# Patient Record
Sex: Female | Born: 1995 | Race: Black or African American | Hispanic: No | Marital: Single | Smoking: Never smoker
Health system: Southern US, Community
[De-identification: ages and names within clinical notes are randomized; demographics above are authoritative.]

## PROBLEM LIST (undated history)

## (undated) DIAGNOSIS — R519 Headache, unspecified: Secondary | ICD-10-CM

## (undated) DIAGNOSIS — J45909 Unspecified asthma, uncomplicated: Secondary | ICD-10-CM

## (undated) HISTORY — PX: NO PAST SURGERIES: SHX2092

## (undated) HISTORY — DX: Headache, unspecified: R51.9

---
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
## 2018-01-01 ENCOUNTER — Ambulatory Visit (INDEPENDENT_AMBULATORY_CARE_PROVIDER_SITE_OTHER): Payer: BLUE CROSS/BLUE SHIELD

## 2018-01-01 ENCOUNTER — Ambulatory Visit
Admission: EM | Admit: 2018-01-01 | Discharge: 2018-01-01 | Disposition: A | Discharge status: Home / Self Care | Payer: BLUE CROSS/BLUE SHIELD | Attending: Family Medicine | Admitting: Family Medicine

## 2018-01-01 ENCOUNTER — Other Ambulatory Visit: Payer: Self-pay

## 2018-01-01 ENCOUNTER — Encounter: Payer: Self-pay | Admitting: Emergency Medicine

## 2018-01-01 DIAGNOSIS — M25532 Pain in left wrist: Secondary | ICD-10-CM | POA: Diagnosis not present

## 2018-01-01 DIAGNOSIS — M659 Synovitis and tenosynovitis, unspecified: Secondary | ICD-10-CM

## 2018-01-01 NOTE — ED Triage Notes (Signed)
Patient in today c/o several month history of left wrist pain. No injury noted. Patient has tried OTC Aleve.

## 2018-01-01 NOTE — ED Triage Notes (Signed)
Patient has tried Mobic 15 mg without relief.

## 2018-01-01 NOTE — ED Provider Notes (Signed)
[facility] ____________________________________________  Time seen: Approximately 4:27 PM  I have reviewed the triage vital signs and the nursing notes.   HISTORY  Chief Complaint Wrist Pain  HPI Brooke Dominguez is a 22 y.o. female presenting for reevaluation of left medial wrist pain that is been present for approximately 6 months.  Patient reports that she has been seen by her primary care for the same and diagnosed with tendinitis.  Reports has also seen orthopedic and had an injection that only lasted for denies any recent trauma, direct injury, paresthesias, decreased range of motion.  States pain is mostly present with wrist rotation or resisted movement of the thumb.  States right-hand dominant, but does do a lot of repetitive motions with both hands.  States had been taking some  prescription anti-inflammatories intermittently without a lot of change.  States does still have Mobic at home.  States had a thumb restrictive wrist brace but lost, and use over-the-counter wrist brace without a lot of change.  Denies other aggravating or alleviating factors, pain radiation or other complaints.  No associated fevers.  Reports otherwise feels well.  Denies pregnancy.  PCP: [facility]  History reviewed. No pertinent past medical history.  There are no active problems to display for this patient.   History reviewed. No pertinent surgical history.   No current facility-administered medications for this encounter.   Current Outpatient Medications:  .  norethindrone-ethinyl estradiol (MICROGESTIN,JUNEL,LOESTRIN) 1-20 MG-MCG tablet, Take 1 tablet by mouth daily., Disp: , Rfl:   Allergies Patient has no known allergies.  Family History  Problem Relation Age of Onset  . Healthy Mother   . Healthy Father     Social History Social History   Tobacco Use  . Smoking status: Never Smoker  . Smokeless tobacco: Never Used  Substance Use Topics  . Alcohol use: Yes    Comment:  socially  . Drug use: No    Review of Systems Constitutional: No fever/chills Cardiovascular: Denies chest pain. Respiratory: Denies shortness of breath. Gastrointestinal: No abdominal pain.   Musculoskeletal: Negative for back pain. AS above.  Skin: Negative for rash.   ____________________________________________   PHYSICAL EXAM:  VITAL SIGNS: ED Triage Vitals [01/01/18 1540]  Enc Vitals Group     BP 137/83     Pulse Rate 94     Resp 16     Temp 98.8 F (37.1 C)     Temp Source Oral     SpO2 100 %     Weight 258 lb (117 kg)     Height 5\' 6"  (1.676 m)     Head Circumference      Peak Flow      Pain Score 8     Pain Loc      Pain Edu?      Excl. in GC?     Constitutional: Alert and oriented. Well appearing and in no acute distress. Cardiovascular: Normal rate, regular rhythm. Grossly normal heart sounds.  Good peripheral circulation. Respiratory: Normal respiratory effort without tachypnea nor retractions. Breath sounds are clear and equal bilaterally. No wheezes, rales, rhonchi. Musculoskeletal:   Bilateral distal radial pulses equal and easily palpated.  Bilateral hand grip strong and equal.  Left distal radial aspect of left wrist mild diffuse tenderness to palpation with increased pain on resisted thumb extension, no pain with thumb flexion, no snuffbox tenderness and no point bony tenderness.  Left wrist with full range of motion present.  Left hand no paresthesias, normal distal  sensation and capillary refill, no motor or tendon deficit noted. Neurologic:  Normal speech and language. Speech is normal. No gait instability.  Skin:  Skin is warm, dry and intact. No rash noted. Psychiatric: Mood and affect are normal. Speech and behavior are normal. Patient exhibits appropriate insight and judgment   ___________________________________________   LABS (all labs ordered are listed, but only abnormal results are displayed)  Labs Reviewed - No data to  display  RADIOLOGY  Dg Wrist Complete Left  Result Date: 01/01/2018 CLINICAL DATA:  Chronic pain.  Trauma 1 day prior EXAM: LEFT WRIST - COMPLETE 3+ VIEW COMPARISON:  Left hand November 15, 2010 FINDINGS: Frontal, oblique, lateral, and ulnar deviation scaphoid images were obtained. There is no evident fracture or dislocation. Joint spaces appear normal. No erosive change or intra-articular calcification. IMPRESSION: No fracture or dislocation.  No apparent arthropathy. Electronically Signed   By: Bretta BangWilliam  Woodruff III M.D.   On: 01/01/2018 16:58   ____________________________________________   PROCEDURES Procedures   Left wrist Velcro thumb spica splint applied.  INITIAL IMPRESSION / ASSESSMENT AND PLAN / ED COURSE  Pertinent labs & imaging results that were available during my care of the patient were reviewed by me and considered in my medical decision making (see chart for details).  Well-appearing patient.  No acute distress.  Suspected left wrist tenosynovitis that has continued.  Patient states that she has not previously had x-ray and request x-ray.  Left wrist x-ray no fracture dislocation no apparent arthropathy per radiologist.  Thumb spica splint applied.  Continue home Mobic.  Encourage reevaluation and follow-up with orthopedic.  Patient agrees this plan.  Discussed follow up with Primary care physician this week. Discussed follow up and return parameters including no resolution or any worsening concerns. Patient verbalized understanding and agreed to plan.   ____________________________________________   FINAL CLINICAL IMPRESSION(S) / ED DIAGNOSES  Final diagnoses:  Tenosynovitis of left wrist     ED Discharge Orders    None       Note: This dictation was prepared with Dragon dictation along with smaller phrase technology. Any transcriptional errors that result from this process are unintentional.         Renford DillsMiller, Camiyah Friberg, NP 01/01/18 1820

## 2018-01-01 NOTE — Discharge Instructions (Signed)
Take medication as prescribed. Rest. Drink plenty of fluids.  Wear wrist splint as discussed.  Follow-up with orthopedic.  Follow up with your primary care physician this week as needed. Return to Urgent care for new or worsening concerns.

## 2018-10-26 ENCOUNTER — Ambulatory Visit
Admission: EM | Admit: 2018-10-26 | Discharge: 2018-10-26 | Disposition: A | Discharge status: Home / Self Care | Payer: BLUE CROSS/BLUE SHIELD | Attending: Family Medicine | Admitting: Family Medicine

## 2018-10-26 ENCOUNTER — Encounter: Payer: Self-pay | Admitting: Emergency Medicine

## 2018-10-26 ENCOUNTER — Other Ambulatory Visit: Payer: Self-pay

## 2018-10-26 DIAGNOSIS — J4521 Mild intermittent asthma with (acute) exacerbation: Secondary | ICD-10-CM | POA: Diagnosis not present

## 2018-10-26 MED ORDER — PREDNISONE 50 MG PO TABS: ORAL_TABLET | ORAL | 0 refills | Status: DC

## 2018-10-26 MED ORDER — BENZONATATE 100 MG PO CAPS: 100.0000 mg | ORAL_CAPSULE | Freq: Three times a day (TID) | ORAL | 0 refills | Status: DC | PRN

## 2018-10-26 NOTE — Discharge Instructions (Signed)
This is viral. It has flared your asthma.  Medications as prescribed.  Albuterol as needed.  Take care  Dr. Adriana Simasook

## 2018-10-26 NOTE — ED Triage Notes (Signed)
Patient c/o cough and chest congestion for 2 weeks.  Patient denies fevers.  

## 2018-10-26 NOTE — ED Provider Notes (Signed)
[facility]    CSN: [id number] Arrival date & time: 10/26/18  1039  History   Chief Complaint Chief Complaint  Patient presents with  . Cough   HPI   22 year old female presents with cough.  2-week history of cough.  Reports congestion.  Reports productive cough.  Associated shortness of breath.  Patient states that she has a history of asthma.  She is been using albuterol with temporary relief.  No other medications or interventions tried.  Cough seems to be worse when she talks a lot.  No fever.  No other associated symptoms. No other complaints.  History reviewed as below.   PMH: Asthma, Obesity  OB History   None    Home Medications    Prior to Admission medications   Medication Sig Start Date End Date Taking? Authorizing Provider  norethindrone-ethinyl estradiol (MICROGESTIN,JUNEL,LOESTRIN) 1-20 MG-MCG tablet Take 1 tablet by mouth daily.   Yes [provider]  benzonatate (TESSALON) 100 MG capsule Take 1 capsule (100 mg total) by mouth 3 (three) times daily as needed. 10/26/18   Tommie Sams, DO  predniSONE (DELTASONE) 50 MG tablet 1 tablet daily x 5 days 10/26/18   Tommie Sams, DO   Family History Family History  Problem Relation Age of Onset  . Healthy Mother   . Healthy Father    Social History Social History   Tobacco Use  . Smoking status: Never Smoker  . Smokeless tobacco: Never Used  Substance Use Topics  . Alcohol use: Yes    Comment: socially  . Drug use: No   Allergies   Patient has no known allergies.   Review of Systems Review of Systems  Constitutional: Negative for fever.  Respiratory: Positive for cough and shortness of breath.    Physical Exam Triage Vital Signs ED Triage Vitals  Enc Vitals Group     BP 10/26/18 1056 136/80     Pulse Rate 10/26/18 1056 88     Resp 10/26/18 1056 16     Temp 10/26/18 1056 98.4 F (36.9 C)     Temp Source 10/26/18 1056 Oral     SpO2 10/26/18 1056 100 %     Weight  10/26/18 1054 257 lb (116.6 kg)     Height 10/26/18 1054 5\' 6"  (1.676 m)     Head Circumference --      Peak Flow --      Pain Score 10/26/18 1053 6     Pain Loc --      Pain Edu? --    Updated Vital Signs BP 136/80 (BP Location: Left Arm)   Pulse 88   Temp 98.4 F (36.9 C) (Oral)   Resp 16   Ht 5\' 6"  (1.676 m)   Wt 116.6 kg   LMP 10/05/2018   SpO2 100%   BMI 41.48 kg/m   Visual Acuity Right Eye Distance:   Left Eye Distance:   Bilateral Distance:    Right Eye Near:   Left Eye Near:    Bilateral Near:     Physical Exam  Constitutional: She is oriented to person, place, and time. She appears well-developed. No distress.  HENT:  Head: Normocephalic and atraumatic.  Mouth/Throat: Oropharynx is clear and moist.  Cardiovascular: Normal rate and regular rhythm.  Pulmonary/Chest: Effort normal and breath sounds normal. She has no wheezes. She has no rales.  Neurological: She is alert and oriented to person, place, and time.  Psychiatric: She has a normal mood and affect.  Her behavior is normal.  Nursing note and vitals reviewed.  UC Treatments / Results  Labs (all labs ordered are listed, but only abnormal results are displayed) Labs Reviewed - No data to display  EKG None  Radiology No results found.  Procedures Procedures (including critical care time)  Medications Ordered in UC Medications - No data to display  Initial Impression / Assessment and Plan / UC Course  I have reviewed the triage vital signs and the nursing notes.  Pertinent labs & imaging results that were available during my care of the patient were reviewed by me and considered in my medical decision making (see chart for details).    22 year old female presents with viral illness which is likely related to asthma exacerbation.  Treating with prednisone and Tessalon Perles.  Final Clinical Impressions(s) / UC Diagnoses   Final diagnoses:  Mild intermittent asthma with exacerbation      Discharge Instructions     This is viral. It has flared your asthma.  Medications as prescribed.  Albuterol as needed.  Take care  Dr. Adriana Simasook    ED Prescriptions    Medication Sig Dispense Auth. Provider   predniSONE (DELTASONE) 50 MG tablet 1 tablet daily x 5 days 5 tablet Pascual Mantel G, DO   benzonatate (TESSALON) 100 MG capsule Take 1 capsule (100 mg total) by mouth 3 (three) times daily as needed. 30 capsule Tommie Samsook, Tiyah Zelenak G, DO     Controlled Substance Prescriptions [place] Controlled Substance Registry consulted? Not Applicable   Tommie SamsCook, Voshon Petro G, DO 10/26/18 1205

## 2018-11-02 ENCOUNTER — Other Ambulatory Visit: Payer: Self-pay

## 2018-11-02 ENCOUNTER — Ambulatory Visit
Admission: EM | Admit: 2018-11-02 | Discharge: 2018-11-02 | Disposition: A | Discharge status: Home / Self Care | Payer: Self-pay | Attending: Family Medicine | Admitting: Family Medicine

## 2018-11-02 ENCOUNTER — Encounter: Payer: Self-pay | Admitting: Emergency Medicine

## 2018-11-02 ENCOUNTER — Ambulatory Visit (INDEPENDENT_AMBULATORY_CARE_PROVIDER_SITE_OTHER): Payer: BLUE CROSS/BLUE SHIELD

## 2018-11-02 DIAGNOSIS — B9789 Other viral agents as the cause of diseases classified elsewhere: Secondary | ICD-10-CM

## 2018-11-02 DIAGNOSIS — J4521 Mild intermittent asthma with (acute) exacerbation: Secondary | ICD-10-CM

## 2018-11-02 DIAGNOSIS — J029 Acute pharyngitis, unspecified: Secondary | ICD-10-CM

## 2018-11-02 DIAGNOSIS — J069 Acute upper respiratory infection, unspecified: Secondary | ICD-10-CM

## 2018-11-02 DIAGNOSIS — R05 Cough: Secondary | ICD-10-CM

## 2018-11-02 HISTORY — DX: Unspecified asthma, uncomplicated: J45.909

## 2018-11-02 LAB — RAPID STREP SCREEN (MED CTR MEBANE ONLY): Streptococcus, Group A Screen (Direct): NEGATIVE

## 2018-11-02 MED ORDER — PREDNISONE 20 MG PO TABS: ORAL_TABLET | ORAL | 0 refills | Status: DC

## 2018-11-02 NOTE — ED Triage Notes (Signed)
Patient in today with continued cough and chest congestion. Patient was seen on 11/25/18 and prescribed Tessalon Perles and Prednisone. Patient hasn't finish Prednisone, but feels like she is getting worse. Cough is now productive and patient feels sob.

## 2018-11-02 NOTE — ED Triage Notes (Signed)
Patient now states that she finished Prednisone, but still has LawyerTessalon Perles.

## 2018-11-02 NOTE — ED Provider Notes (Signed)
[facility]    CSN: [id number] Arrival date & time: 11/02/18  1203     History   Chief Complaint Chief Complaint  Patient presents with  . Cough    APPT    HPI Brooke Dominguez is a 22 y.o. female.   The history is provided by the patient.  Cough  Associated symptoms: sore throat and wheezing   URI  Presenting symptoms: congestion, cough and sore throat   Severity:  Moderate Onset quality:  Sudden Duration:  2 weeks Timing:  Constant Progression:  Partially resolved Chronicity:  New Relieved by:  Prescription medications (seen here about 1 week ago and diagnosed with asthma exacerbation) Associated symptoms: wheezing   Risk factors: chronic respiratory disease (asthma) and sick contacts     Past Medical History:  Diagnosis Date  . Asthma     There are no active problems to display for this patient.   Past Surgical History:  Procedure Laterality Date  . NO PAST SURGERIES      OB History   None      Home Medications    Prior to Admission medications   Medication Sig Start Date End Date Taking? Authorizing Provider  benzonatate (TESSALON) 100 MG capsule Take 1 capsule (100 mg total) by mouth 3 (three) times daily as needed. 10/26/18  Yes Cook, Jayce G, DO  norethindrone-ethinyl estradiol (MICROGESTIN,JUNEL,LOESTRIN) 1-20 MG-MCG tablet Take 1 tablet by mouth daily.    [provider]  predniSONE (DELTASONE) 20 MG tablet 3 tabs po qd x 2 days, then 2 tabs po qd x 2 days, then 1 tab po qd x 2 days, then half a tab po qd x 2 days 11/02/18   Payton Mccallumonty, Joandry Slagter, MD    Family History Family History  Problem Relation Age of Onset  . Healthy Mother   . Healthy Father     Social History Social History   Tobacco Use  . Smoking status: Never Smoker  . Smokeless tobacco: Never Used  Substance Use Topics  . Alcohol use: Yes    Comment: socially  . Drug use: No     Allergies   Pineapple; Nickel; and Other   Review of Systems Review  of Systems  HENT: Positive for congestion and sore throat.   Respiratory: Positive for cough and wheezing.      Physical Exam Triage Vital Signs ED Triage Vitals  Enc Vitals Group     BP 11/02/18 1218 (!) 142/93     Pulse Rate 11/02/18 1218 (!) 104     Resp 11/02/18 1218 20     Temp 11/02/18 1218 98.4 F (36.9 C)     Temp Source 11/02/18 1218 Oral     SpO2 11/02/18 1218 100 %     Weight 11/02/18 1219 257 lb (116.6 kg)     Height 11/02/18 1219 5\' 5"  (1.651 m)     Head Circumference --      Peak Flow --      Pain Score 11/02/18 1218 5     Pain Loc --      Pain Edu? --      Excl. in GC? --    No data found.  Updated Vital Signs BP (!) 142/93 (BP Location: Left Arm)   Pulse (!) 104   Temp 98.4 F (36.9 C) (Oral)   Resp 20   Ht 5\' 5"  (1.651 m)   Wt 116.6 kg   LMP 11/01/2018 (Exact Date)   SpO2 100%  BMI 42.77 kg/m   Visual Acuity Right Eye Distance:   Left Eye Distance:   Bilateral Distance:    Right Eye Near:   Left Eye Near:    Bilateral Near:     Physical Exam  Constitutional: She appears well-developed and well-nourished. No distress.  HENT:  Nose: Mucosal edema and rhinorrhea present. No nose lacerations, sinus tenderness, nasal deformity, septal deviation or nasal septal hematoma. No epistaxis.  No foreign bodies.  Mouth/Throat: Uvula is midline and mucous membranes are normal. Posterior oropharyngeal erythema present. No posterior oropharyngeal edema or tonsillar abscesses. No tonsillar exudate.  Neck: Normal range of motion. Neck supple. No thyromegaly present.  Cardiovascular: Normal rate, regular rhythm and normal heart sounds.  Pulmonary/Chest: Effort normal and breath sounds normal. No stridor. No respiratory distress. She has no wheezes. She has no rales.  Lymphadenopathy:    She has no cervical adenopathy.  Skin: She is not diaphoretic.  Nursing note and vitals reviewed.    UC Treatments / Results  Labs (all labs ordered are listed, but  only abnormal results are displayed) Labs Reviewed  RAPID STREP SCREEN ([facility] ONLY)  CULTURE, GROUP A STREP [facility])    EKG None  Radiology Dg Chest 2 View  Result Date: 11/02/2018 CLINICAL DATA:  Productive cough with shortness of breath for 3 weeks. Persistent cough and upper chest soreness. EXAM: CHEST - 2 VIEW COMPARISON:  None. FINDINGS: Heart size and mediastinal contours are normal. Lungs appear clear. No pleural effusion seen. Osseous structures about the chest are unremarkable. IMPRESSION: No active cardiopulmonary disease. No evidence of pneumonia or pulmonary edema. Electronically Signed   By: Bary Richard M.D.   On: 11/02/2018 12:55    Procedures Procedures (including critical care time)  Medications Ordered in UC Medications - No data to display  Initial Impression / Assessment and Plan / UC Course  I have reviewed the triage vital signs and the nursing notes.  Pertinent labs & imaging results that were available during my care of the patient were reviewed by me and considered in my medical decision making (see chart for details).      Final Clinical Impressions(s) / UC Diagnoses   Final diagnoses:  Viral URI with cough  Mild intermittent asthma with exacerbation    ED Prescriptions    Medication Sig Dispense Auth. Provider   predniSONE (DELTASONE) 20 MG tablet 3 tabs po qd x 2 days, then 2 tabs po qd x 2 days, then 1 tab po qd x 2 days, then half a tab po qd x 2 days 13 tablet Payton Mccallum, MD     1. Lab/chest x-ray results and diagnosis reviewed with patient 2. rx as per orders above; reviewed possible side effects, interactions, risks and benefits  3. Recommend supportive treatment with continue current asthma inhalers 4. Follow-up prn if symptoms worsen or don't improve    Controlled Substance Prescriptions [place] Controlled Substance Registry consulted? Not Applicable   Payton Mccallum, MD 11/02/18 1406

## 2018-11-05 LAB — CULTURE, GROUP A STREP (THRC)

## 2019-09-05 ENCOUNTER — Other Ambulatory Visit: Payer: Self-pay | Admitting: *Deleted

## 2019-09-05 DIAGNOSIS — Z20822 Contact with and (suspected) exposure to covid-19: Secondary | ICD-10-CM

## 2019-09-06 LAB — NOVEL CORONAVIRUS, NAA: SARS-CoV-2, NAA: NOT DETECTED

## 2019-09-18 ENCOUNTER — Other Ambulatory Visit: Payer: Self-pay | Admitting: *Deleted

## 2019-09-18 DIAGNOSIS — Z20822 Contact with and (suspected) exposure to covid-19: Secondary | ICD-10-CM

## 2019-09-19 LAB — NOVEL CORONAVIRUS, NAA: SARS-CoV-2, NAA: NOT DETECTED

## 2019-10-08 IMAGING — CR DG CHEST 2V
2 series · 2 of 2 positions shown · non-contrast
Comparison: None.

CLINICAL DATA: Productive cough with shortness of breath for 3
weeks. Persistent cough and upper chest soreness.

EXAM:
CHEST - 2 VIEW

[chest pa]
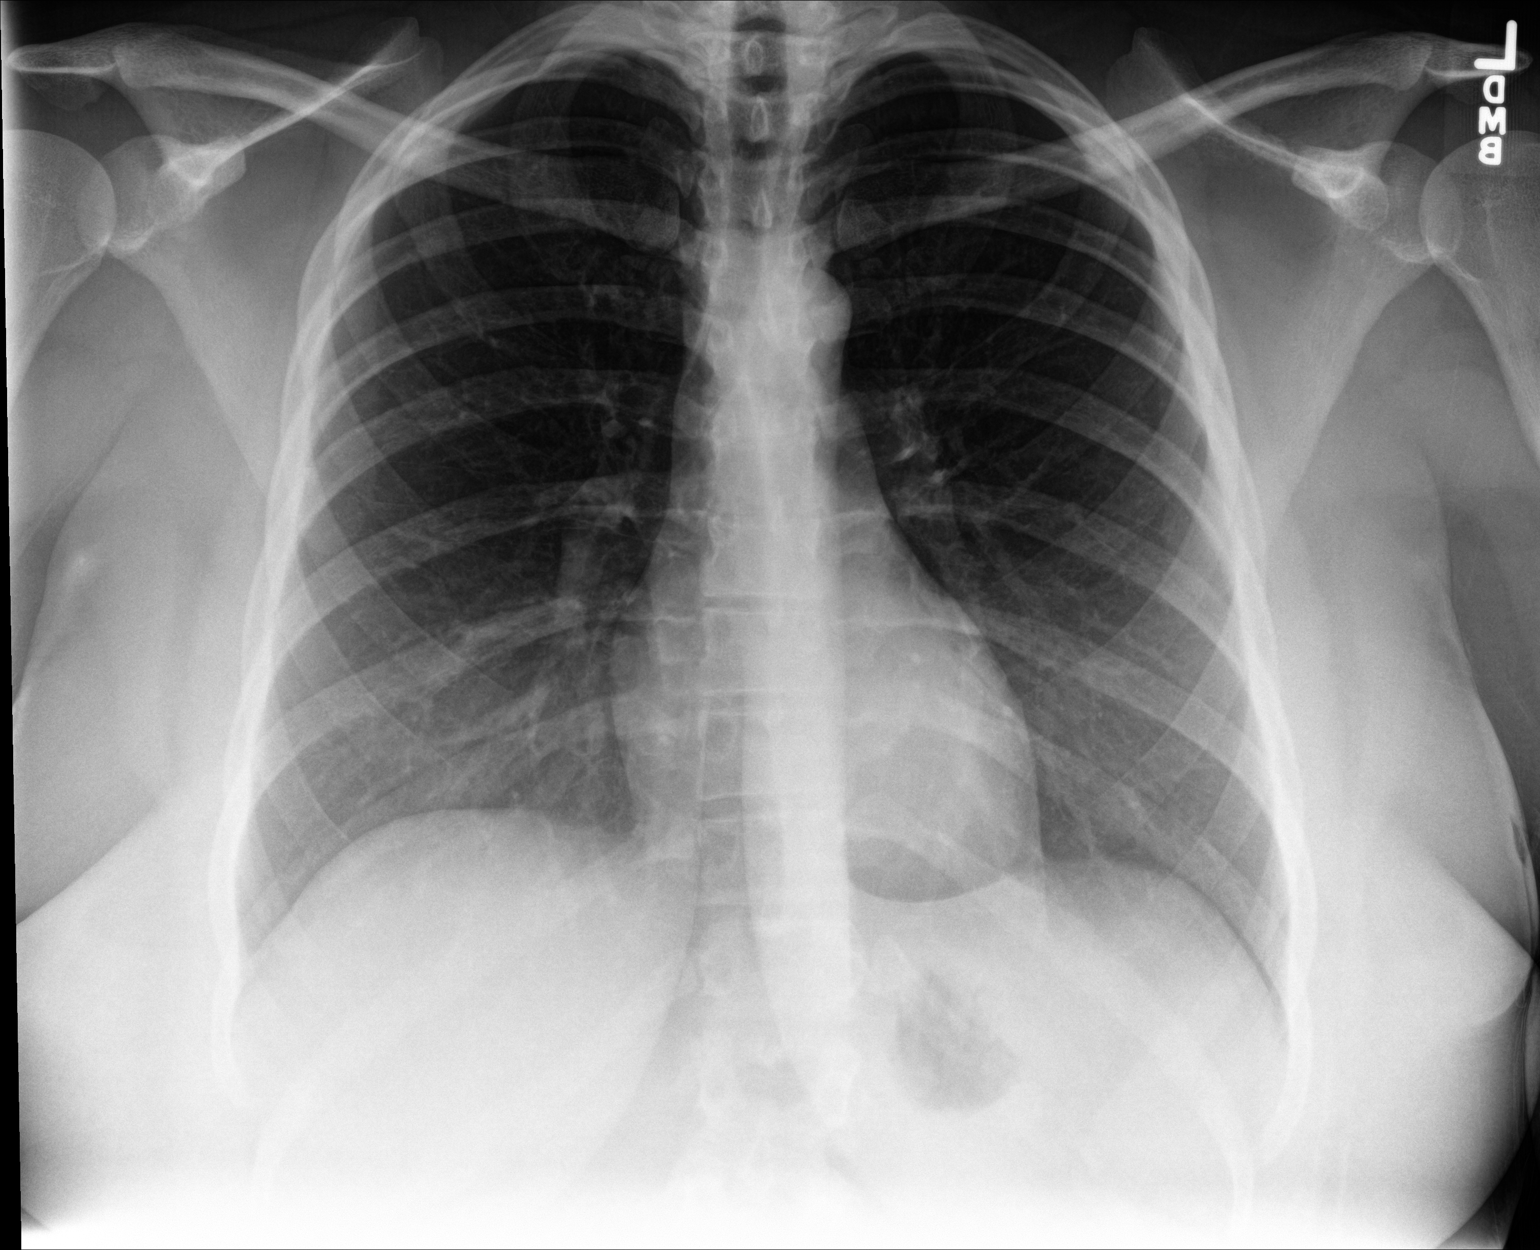

[chest lat]
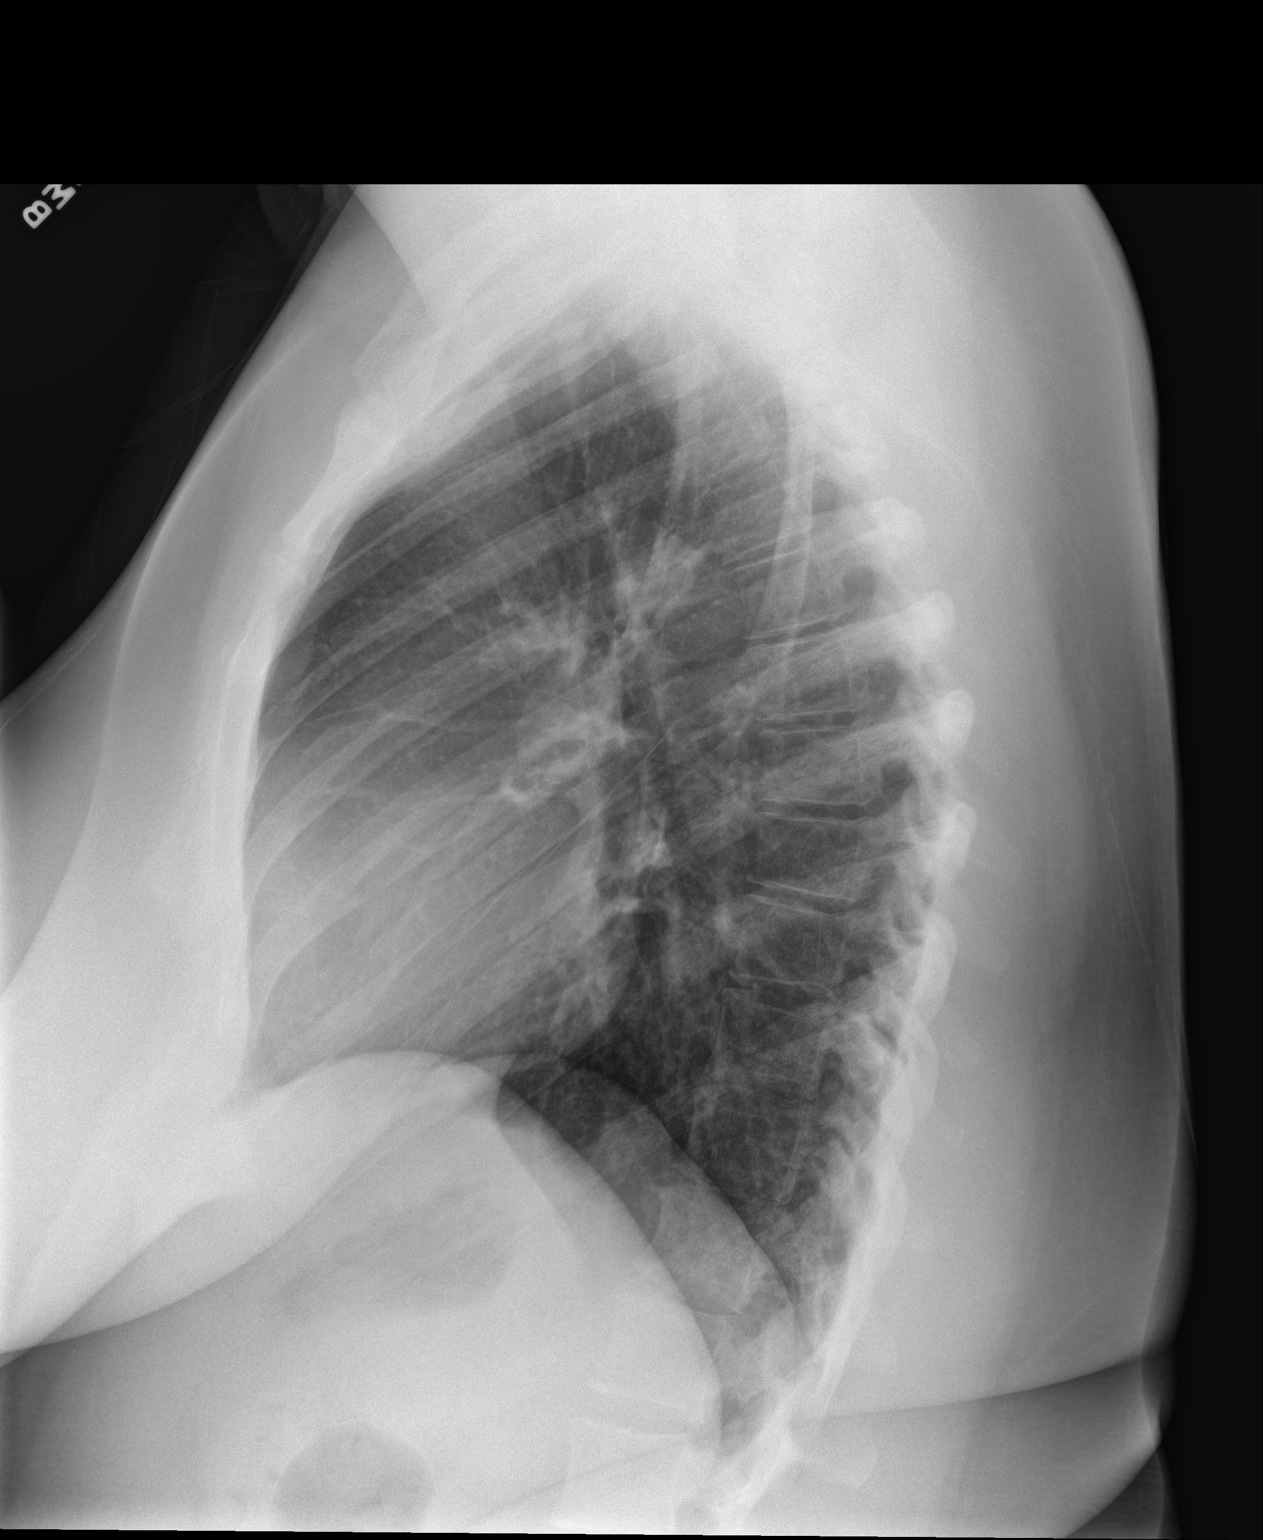

[2 of 2 positions shown; findings below may reference images not displayed]

FINDINGS: Heart size and mediastinal contours are normal. Lungs appear clear.
No pleural effusion seen. Osseous structures about the chest are
unremarkable.
IMPRESSION: No active cardiopulmonary disease. No evidence of pneumonia or
pulmonary edema.

## 2019-11-06 ENCOUNTER — Other Ambulatory Visit: Payer: Self-pay

## 2019-11-06 DIAGNOSIS — Z20822 Contact with and (suspected) exposure to covid-19: Secondary | ICD-10-CM

## 2019-11-07 LAB — NOVEL CORONAVIRUS, NAA: SARS-CoV-2, NAA: NOT DETECTED

## 2020-11-28 HISTORY — PX: DILATION AND CURETTAGE OF UTERUS: SHX78

## 2021-03-17 ENCOUNTER — Emergency Department (HOSPITAL_COMMUNITY)
Admission: EM | Admit: 2021-03-17 | Discharge: 2021-03-17 | Disposition: A | Discharge status: Home / Self Care | Payer: Self-pay | Attending: Emergency Medicine | Admitting: Emergency Medicine

## 2021-03-17 ENCOUNTER — Encounter (HOSPITAL_COMMUNITY): Payer: Self-pay | Admitting: Emergency Medicine

## 2021-03-17 ENCOUNTER — Other Ambulatory Visit: Payer: Self-pay

## 2021-03-17 DIAGNOSIS — H66011 Acute suppurative otitis media with spontaneous rupture of ear drum, right ear: Secondary | ICD-10-CM | POA: Insufficient documentation

## 2021-03-17 DIAGNOSIS — J45909 Unspecified asthma, uncomplicated: Secondary | ICD-10-CM | POA: Insufficient documentation

## 2021-03-17 DIAGNOSIS — H60503 Unspecified acute noninfective otitis externa, bilateral: Secondary | ICD-10-CM | POA: Insufficient documentation

## 2021-03-17 MED ORDER — CIPRO HC 0.2-1 % OT SUSP: 3.0000 [drp] | Freq: Two times a day (BID) | OTIC | 0 refills | Status: DC

## 2021-03-17 MED ORDER — AMOXICILLIN 500 MG PO CAPS: 500.0000 mg | ORAL_CAPSULE | Freq: Three times a day (TID) | ORAL | 0 refills | Status: DC

## 2021-03-17 MED ORDER — BACITRACIN ZINC 500 UNIT/GM EX OINT: 1.0000 "application " | TOPICAL_OINTMENT | Freq: Two times a day (BID) | CUTANEOUS | 0 refills | Status: DC

## 2021-03-17 MED ORDER — AMOXICILLIN 500 MG PO CAPS
500.0000 mg | ORAL_CAPSULE | Freq: Once | ORAL | Status: AC
  Administered 2021-03-17: 500 mg via ORAL
  Filled 2021-03-17: qty 1

## 2021-03-17 MED ORDER — CIPROFLOXACIN-DEXAMETHASONE 0.3-0.1 % OT SUSP
4.0000 [drp] | Freq: Once | OTIC | Status: AC
  Administered 2021-03-17: 4 [drp] via OTIC
  Filled 2021-03-17: qty 7.5

## 2021-03-17 NOTE — ED Provider Notes (Signed)
[facility]-EMERGENCY DEPT Provider Note   CSN: [id number] Arrival date & time: 03/17/21  2103     History Chief Complaint  Patient presents with  . Otalgia    Brooke Dominguez is a 25 y.o. female with history significant for asthma who presents for evaluation of bilateral ear pain.  Patient states she has had intermittent pain since February.  Worsening over the last week.  She has noted discharge to her bilateral ears are worse usually clear.  She feels like she has a muffled hearing to her right ear.  She does use Q-tips daily.  No fever, chills, nausea, vomiting, neck pain, neck stiffness, facial swelling, tinnitus.  Denies additional aggravating or alleviating factors.  She has not followed with anyone for this previously.  Pain in 8/10.    No hx of DM.  Denies chance of pregnancy.  History obtained from patient and past medical records.  No interpreter used.  HPI     Past Medical History:  Diagnosis Date  . Asthma     There are no problems to display for this patient.   Past Surgical History:  Procedure Laterality Date  . NO PAST SURGERIES       OB History   No obstetric history on file.     Family History  Problem Relation Age of Onset  . Healthy Mother   . Healthy Father     Social History   Tobacco Use  . Smoking status: Never Smoker  . Smokeless tobacco: Never Used  Vaping Use  . Vaping Use: Never used  Substance Use Topics  . Alcohol use: Yes    Comment: socially  . Drug use: No    Home Medications Prior to Admission medications   Medication Sig Start Date End Date Taking? Authorizing Provider  albuterol (VENTOLIN HFA) 108 (90 Base) MCG/ACT inhaler Inhale 2 puffs into the lungs every 6 (six) hours as needed for wheezing. 03/06/20  Yes [provider]  amoxicillin (AMOXIL) 500 MG capsule Take 1 capsule (500 mg total) by mouth 3 (three) times daily. 03/17/21  Yes Kaziah Krizek A, PA-C  bacitracin ointment Apply 1  application topically 2 (two) times daily. 03/17/21  Yes Max Nuno A, PA-C  ciprofloxacin-hydrocortisone (CIPRO HC) OTIC suspension Place 3 drops into both ears 2 (two) times daily. 03/17/21  Yes Loralyn Rachel A, PA-C  OVER THE COUNTER MEDICATION Place 3 drops into both ears daily as needed (for itching in the ears).   Yes [provider]  norethindrone-ethinyl estradiol (MICROGESTIN,JUNEL,LOESTRIN) 1-20 MG-MCG tablet Take 1 tablet by mouth daily. Patient not taking: No sig reported  03/17/21  [provider]    Allergies    Pineapple, Nickel, and Other  Review of Systems   Review of Systems  Constitutional: Negative.   HENT: Positive for ear discharge and ear pain.   Respiratory: Negative.   Cardiovascular: Negative.   Gastrointestinal: Negative.   Genitourinary: Negative.   Musculoskeletal: Negative.   Skin: Negative.   Neurological: Negative.   All other systems reviewed and are negative.   Physical Exam Updated Vital Signs BP (!) 142/95 (BP Location: Right Arm)   Pulse (!) 109   Temp 98.7 F (37.1 C) (Oral)   Resp 16   Ht 5\' 6"  (1.676 m)   Wt 124.3 kg   SpO2 100%   BMI 44.22 kg/m   Physical Exam Vitals and nursing note reviewed.  Constitutional:      General: She is not in  acute distress.    Appearance: She is well-developed. She is not ill-appearing, toxic-appearing or diaphoretic.  HENT:     Head: Normocephalic and atraumatic.     Jaw: There is normal jaw occlusion.     Right Ear: Tenderness present. There is no impacted cerumen. Tympanic membrane is erythematous and bulging.     Left Ear: Tenderness present. There is no impacted cerumen. Tympanic membrane is perforated.     Ears:     Comments: Right ear-perforated TM.  Yellow, thin discharge in middle ear canal.  Dry, flaky skin to external ear canal.  Tenderness over tragus  Left ear-bulging left TM.  She is thin, yellow discharge in her middle ear canal.  She is dry, flaky skin to  external ear canal.  Tenderness over tragus.    Nose: Nose normal.     Mouth/Throat:     Mouth: Mucous membranes are moist.  Eyes:     Pupils: Pupils are equal, round, and reactive to light.  Neck:     Vascular: No carotid bruit.     Comments: Full range of motion.  No meningismus. Cardiovascular:     Rate and Rhythm: Normal rate.  Pulmonary:     Effort: No respiratory distress.  Abdominal:     General: There is no distension.  Musculoskeletal:        General: Normal range of motion.     Cervical back: Normal range of motion and neck supple. No rigidity or tenderness.  Lymphadenopathy:     Cervical: No cervical adenopathy.  Skin:    General: Skin is warm and dry.  Neurological:     Mental Status: She is alert.     ED Results / Procedures / Treatments   Labs (all labs ordered are listed, but only abnormal results are displayed) Labs Reviewed - No data to display  EKG None  Radiology No results found.  Procedures Procedures   Medications Ordered in ED Medications  ciprofloxacin-dexamethasone (CIPRODEX) 0.3-0.1 % OTIC (EAR) suspension 4 drop (4 drops Both EARS Given 03/17/21 2204)  amoxicillin (AMOXIL) capsule 500 mg (500 mg Oral Given 03/17/21 2204)    ED Course  I have reviewed the triage vital signs and the nursing notes.  Pertinent labs & imaging results that were available during my care of the patient were reviewed by me and considered in my medical decision making (see chart for details).  Patient here for bilateral ear pain.  She is afebrile, nonseptic, not ill-appearing.  She has no neck stiffness or neck rigidity.  She has no pulsatile tenderness.  No facial swelling.  Right ear does have a perforated TM.  Left ear with bulging left TM with fluid level.  She has thin, yellow copious discharge to bilateral middle ear canals.  She has dry, flaking skin to external ear canals.  Patient does not appear septic.  No systemic symptoms.  We will treat for ruptured TM  on right, bilateral otitis externa.  Given medications here in ED.  She will need to follow-up with ENT.  No canal occlusion, Pt afebrile in NAD. Exam non concerning for mastoiditis, cellulitis or malignant OE.   The patient has been appropriately medically screened and/or stabilized in the ED. I have low suspicion for any other emergent medical condition which would require further screening, evaluation or treatment in the ED or require inpatient management.  Patient is hemodynamically stable and in no acute distress.  Patient able to ambulate in department prior to ED.  Evaluation  does not show acute pathology that would require ongoing or additional emergent interventions while in the emergency department or further inpatient treatment.  I have discussed the diagnosis with the patient and answered all questions.  Pain is been managed while in the emergency department and patient has no further complaints prior to discharge.  Patient is comfortable with plan discussed in room and is stable for discharge at this time.  I have discussed strict return precautions for returning to the emergency department.  Patient was encouraged to follow-up with PCP/specialist refer to at discharge.    MDM Rules/Calculators/A&P                          Final Clinical Impression(s) / ED Diagnoses Final diagnoses:  Acute otitis externa of both ears, unspecified type  Non-recurrent acute suppurative otitis media of right ear with spontaneous rupture of tympanic membrane    Rx / DC Orders ED Discharge Orders         Ordered    ciprofloxacin-hydrocortisone (CIPRO HC) OTIC suspension  2 times daily        03/17/21 2231    amoxicillin (AMOXIL) 500 MG capsule  3 times daily        03/17/21 2231    bacitracin ointment  2 times daily        03/17/21 2231           Calysta Craigo A, PA-C 03/17/21 2236    Arby Barrette, MD 03/30/21 1536

## 2021-03-17 NOTE — ED Triage Notes (Signed)
Patient presents with recurrent ear infections since mid February. While the symptoms are felt bilaterally, the right ear is worse. She woke up in moderate severe pain today. OTC drops are not helping.

## 2021-03-17 NOTE — Discharge Instructions (Addendum)
Place 4 drops in your bilateral ears daily.  You may use the drops provided to you here in the emergency department.  Take the oral antibiotics.  Take with food.  Use the antibiotic for the outside of your ear.  Follow up with ear nose and throat providers if your symptoms do not resolve.  Their contact information is listed on discharge paperwork.  Return for any worsening symptoms

## 2021-04-16 ENCOUNTER — Other Ambulatory Visit: Payer: Self-pay

## 2021-04-16 ENCOUNTER — Emergency Department (HOSPITAL_COMMUNITY)
Admission: EM | Admit: 2021-04-16 | Discharge: 2021-04-16 | Disposition: A | Discharge status: Home / Self Care | Payer: Self-pay | Attending: Emergency Medicine | Admitting: Emergency Medicine

## 2021-04-16 ENCOUNTER — Encounter (HOSPITAL_COMMUNITY): Payer: Self-pay | Admitting: Emergency Medicine

## 2021-04-16 ENCOUNTER — Emergency Department (HOSPITAL_COMMUNITY): Payer: Self-pay

## 2021-04-16 DIAGNOSIS — S93402A Sprain of unspecified ligament of left ankle, initial encounter: Secondary | ICD-10-CM | POA: Insufficient documentation

## 2021-04-16 DIAGNOSIS — J45909 Unspecified asthma, uncomplicated: Secondary | ICD-10-CM | POA: Insufficient documentation

## 2021-04-16 DIAGNOSIS — X58XXXA Exposure to other specified factors, initial encounter: Secondary | ICD-10-CM | POA: Insufficient documentation

## 2021-04-16 NOTE — Discharge Instructions (Addendum)
You were seen in the emergency department today for your ankle pain. Your x-ray was reassuring, there are no broken bones or dislocations.  You have sprained your ankle.  You have been placed in a lace up brace to wear for extra support, you may use the crutches as needed.  Please begin to bear weight on your ankle as tolerated over the next few days. You may take Tylenol and ibuprofen at home as needed for your pain. Return to ER if you develop any numbness, tingling, weakness in your foot, or any other new severe symptoms.

## 2021-04-16 NOTE — ED Triage Notes (Signed)
Patient here from home reporting left ankle pain after trip but no fall. Denies hitting on anything. Ambulatory.
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
## 2021-09-21 ENCOUNTER — Ambulatory Visit (INDEPENDENT_AMBULATORY_CARE_PROVIDER_SITE_OTHER): Payer: 59 | Admitting: Psychology

## 2021-09-21 DIAGNOSIS — F4321 Adjustment disorder with depressed mood: Secondary | ICD-10-CM | POA: Diagnosis not present

## 2021-10-19 ENCOUNTER — Ambulatory Visit: Payer: 59 | Admitting: Psychology

## 2021-11-04 ENCOUNTER — Ambulatory Visit (INDEPENDENT_AMBULATORY_CARE_PROVIDER_SITE_OTHER): Payer: 59 | Admitting: Psychology

## 2021-11-04 DIAGNOSIS — F4321 Adjustment disorder with depressed mood: Secondary | ICD-10-CM

## 2021-11-04 NOTE — Progress Notes (Signed)
 Behavioral Health Counselor/Therapist Progress Note  Patient ID: Brooke Dominguez, MRN: [id number],    Date: 11/04/2021  Time Spent: 60   Treatment Type: Individual Therapy  Reported Symptoms: sadness, grief  Mental Status Exam: Appearance:  Casual     Behavior: Appropriate  Motor: Normal  Speech/Language:  Normal Rate  Affect: Appropriate  Mood: normal  Thought process: normal  Thought content:   WNL  Sensory/Perceptual disturbances:   WNL  Orientation: oriented to person, place, and time/date  Attention: Good  Concentration: Good  Memory: WNL  Fund of knowledge:  Good  Insight:   Good  Judgment:  Good  Impulse Control: Good   Risk Assessment: Danger to Self:  No Self-injurious Behavior: No Danger to Others: No Duty to Warn:no Physical Aggression / Violence:No  Access to Firearms a concern: No  Gang Involvement:No   Subjective:  Pt present for face-to-face individual therapy via video Webex.  Pt consents to telehealth video session due to COVID 19 pandemic. Location of pt: home Location of therapist: home office.   Pt states she has had her ups and downs but overall she feels like she has been doing a little better.   Pt talked about talking to her boyfriend about asking for more support and he responded favorably and has increased his support.  Pt talked about feeling sad when she sees babies.  Addressed pt's grief and helped her process her feelings.  Worked with pt on her grief process.  Addressed ways she can memorialize the babies (she aborted twins).   Pt talked about the decision to have the abortion.  She feels like her boyfriend was 80% of the decision and she was only about 20%.    Pt was also worried about what her parents would think bc they are ministers.    Pt has also relied on friends more and shared her feelings.  Pt has started to journal her feelings.   Pt talked about work.  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    Worked on [employer], dispensing. Provided supportive therapy.     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 Plan to see pt in a month.   Kayliegh Boyers, LCSW

## 2021-12-20 ENCOUNTER — Ambulatory Visit (INDEPENDENT_AMBULATORY_CARE_PROVIDER_SITE_OTHER): Payer: 59 | Admitting: Psychology

## 2021-12-20 DIAGNOSIS — F4321 Adjustment disorder with depressed mood: Secondary | ICD-10-CM
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
## 2022-01-04 ENCOUNTER — Ambulatory Visit (INDEPENDENT_AMBULATORY_CARE_PROVIDER_SITE_OTHER): Payer: 59 | Admitting: Psychology

## 2022-01-04 DIAGNOSIS — F4321 Adjustment disorder with depressed mood: Secondary | ICD-10-CM

## 2022-01-04 NOTE — Progress Notes (Signed)
[facility] Counselor/Therapist Progress Note  Patient ID: Brooke Dominguez, MRN: [id number],    Date: 01/04/2022  Time Spent: 11:00am-11:45am    45 minutes  Treatment Type: Individual Therapy  Reported Symptoms: sadness, grief  Mental Status Exam: Appearance:  Casual     Behavior: Appropriate  Motor: Normal  Speech/Language:  Normal Rate  Affect: Appropriate  Mood: normal  Thought process: normal  Thought content:   WNL  Sensory/Perceptual disturbances:   WNL  Orientation: oriented to person, place, and time/date  Attention: Good  Concentration: Good  Memory: WNL  Fund of knowledge:  Good  Insight:   Good  Judgment:  Good  Impulse Control: Good   Risk Assessment: Danger to Self:  No Self-injurious Behavior: No Danger to Others: No Duty to Warn:no Physical Aggression / Violence:No  Access to Firearms a concern: No  Gang Involvement:No   Subjective:  Pt present for face-to-face individual therapy via video Webex.  Pt consents to telehealth video session due to COVID 19 pandemic. Location of pt: home Location of therapist: home office.   Pt talked about how she coped with February 4th being the due date of the babies she aborted.   Pt had a lot of thoughts about wondering what life would look like if she had kept the babies.   Pt's boyfriend Josph Macho was minimally supportive but her friends were supportive of her.   Helped pt process her feelings and grief.   She feels badly that pt's boyfriend still has not had pt meet his children yet.   Pt has had conversations with her boyfriend about how this upsets her.  Helped pt process her feelings and relationship dynamics.  Pt has been going to the gym for a month to help her increase self care.  Pt is trying to be busy so she does not focus on grief as much.    Pt talked about work.  Work is a good distraction for pt but it is stressful at times.  Worked on Child psychotherapist.    Provided supportive therapy.      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  (Treatment Plan Target Date: 09/21/2022) Pt is progressing toward treatment goals.   Plan to continue to see pt every two weeks.       Hertha Gergen, LCSW

## 2022-01-28 ENCOUNTER — Ambulatory Visit: Payer: 59 | Admitting: Psychology

## 2022-01-28 DIAGNOSIS — F4321 Adjustment disorder with depressed mood: Secondary | ICD-10-CM

## 2022-02-15 ENCOUNTER — Ambulatory Visit (INDEPENDENT_AMBULATORY_CARE_PROVIDER_SITE_OTHER): Payer: 59 | Admitting: Psychology

## 2022-02-15 DIAGNOSIS — F4321 Adjustment disorder with depressed mood: Secondary | ICD-10-CM | POA: Diagnosis not present

## 2022-02-15 NOTE — Progress Notes (Signed)
[facility] Counselor/Therapist Progress Note ? ?Patient ID: Brooke Dominguez, MRN: [id number],   ? ?Date: 02/15/2022 ? ?Time Spent: 10:00am-10:45am    45 minutes ? ?Treatment Type: Individual Therapy ? ?Reported Symptoms: sadness, stress ? ?Mental Status Exam: ?Appearance:  Casual     ?Behavior: Appropriate  ?Motor: Normal  ?Speech/Language:  Normal Rate  ?Affect: Appropriate  ?Mood: normal  ?Thought process: normal  ?Thought content:   WNL  ?Sensory/Perceptual disturbances:   WNL  ?Orientation: oriented to person, place, and time/date  ?Attention: Good  ?Concentration: Good  ?Memory: WNL  ?Fund of knowledge:  Good  ?Insight:   Good  ?Judgment:  Good  ?Impulse Control: Good  ? ?Risk Assessment: ?Danger to Self:  No ?Self-injurious Behavior: No ?Danger to Others: No ?Duty to Warn:no ?Physical Aggression / Violence:No  ?Access to Firearms a concern: No  ?Gang Involvement:No  ? ?Subjective:  ?Pt present for face-to-face individual therapy via video Webex.  Pt consents to telehealth video session due to COVID 19 pandemic. ?Location of pt: home ?Location of therapist: home office.   ?Pt talked about work.  It has been busy and stressful.  ?Worked on stress management.    ?Pt went to a baby shower which was difficult for her since she had her abortion.  Addressed pt's grief process about her abortion.  She is making progress and feeling better than she was.    ?Pt talked about her relationship with her boyfriend.  She still has not met his kids.  Pt's father talked to pt about concerns that pt is getting taken advantage of by her boyfriend.   He is concerned that pt and boyfriend have been together for 2 years and pt has not met his kids yet.   Pt told her boyfriend about the conversation and he got frustrated with her.  They had an argument.  Addressed the interaction.  Helped pt process her feelings and relationship dynamics.   ?Provided supportive therapy.    ? ?Interventions: Cognitive Behavioral Therapy  and Supportive Therapy ? 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 ?Plan:  See pt's Treatment Plan for depression in Therapy Charts.  (Treatment Plan Target Date: 09/21/2022) ?Pt is progressing toward treatment goals.   ?Plan to continue to see pt every two weeks.     ? ? ?Terri Bauert, LCSW ? ? ?

## 2022-03-29 ENCOUNTER — Encounter: Payer: Self-pay | Admitting: Obstetrics and Gynecology

## 2022-03-29 ENCOUNTER — Ambulatory Visit: Payer: 59 | Admitting: Obstetrics and Gynecology

## 2022-03-29 ENCOUNTER — Other Ambulatory Visit (HOSPITAL_COMMUNITY)
Admission: RE | Admit: 2022-03-29 | Discharge: 2022-03-29 | Disposition: A | Discharge status: Home / Self Care | Payer: 59 | Source: Ambulatory Visit | Attending: Obstetrics and Gynecology | Admitting: Obstetrics and Gynecology

## 2022-03-29 DIAGNOSIS — Z01419 Encounter for gynecological examination (general) (routine) without abnormal findings: Secondary | ICD-10-CM | POA: Insufficient documentation

## 2022-03-29 DIAGNOSIS — Z309 Encounter for contraceptive management, unspecified: Secondary | ICD-10-CM | POA: Insufficient documentation

## 2022-03-29 DIAGNOSIS — Z30011 Encounter for initial prescription of contraceptive pills: Secondary | ICD-10-CM | POA: Diagnosis not present

## 2022-03-29 DIAGNOSIS — Z202 Contact with and (suspected) exposure to infections with a predominantly sexual mode of transmission: Secondary | ICD-10-CM

## 2022-03-29 MED ORDER — NORETHIN ACE-ETH ESTRAD-FE 1-20 MG-MCG(24) PO TABS: 1.0000 | ORAL_TABLET | Freq: Every day | ORAL | 11 refills | Status: DC

## 2022-03-29 NOTE — Progress Notes (Signed)
Brooke Dominguez is a 26 y.o. G96P0010 female here for a routine annual gynecologic exam.  Current complaints: Desires STD testing and OCP's.   Denies abnormal vaginal bleeding, discharge, pelvic pain, problems with intercourse or other gynecologic concerns.  ?  ?Gynecologic History ?Patient's last menstrual period was 03/15/2022 (exact date). ?Contraception: condoms ?Last Pap: Uncertain. Results were: normal ?Last mammogram: NA.  ? ?Obstetric History ?OB History  ?Gravida Para Term Preterm AB Living  ?1       1    ?SAB IAB Ectopic Multiple Live Births  ?        0  ?  ?# Outcome Date GA Lbr Len/2nd Weight Sex Delivery Anes PTL Lv  ?1 AB 2022          ? ? ?Past Medical History:  ?Diagnosis Date  ? Asthma   ? Headache   ? ? ?Past Surgical History:  ?Procedure Laterality Date  ? NO PAST SURGERIES    ? ? ?Current Outpatient Medications on File Prior to Visit  ?Medication Sig Dispense Refill  ? albuterol (VENTOLIN HFA) 108 (90 Base) MCG/ACT inhaler Inhale 2 puffs into the lungs every 6 (six) hours as needed for wheezing.    ? bacitracin ointment Apply 1 application topically 2 (two) times daily. 120 g 0  ? OVER THE COUNTER MEDICATION Place 3 drops into both ears daily as needed (for itching in the ears).    ? [DISCONTINUED] norethindrone-ethinyl estradiol (MICROGESTIN,JUNEL,LOESTRIN) 1-20 MG-MCG tablet Take 1 tablet by mouth daily. (Patient not taking: No sig reported)    ? ?No current facility-administered medications on file prior to visit.  ? ? ?Allergies  ?Allergen Reactions  ? Pineapple Itching and Shortness Of Breath  ? Nickel Itching  ? Other Itching  ?  Cats/dogs and seasonal allergies causes itching.  ? ? ?Social History  ? ?Socioeconomic History  ? Marital status: Single  ?  Spouse name: Not on file  ? Number of children: Not on file  ? Years of education: Not on file  ? Highest education level: Not on file  ?Occupational History  ? Not on file  ?Tobacco Use  ? Smoking status: Never  ? Smokeless tobacco: Never   ?Vaping Use  ? Vaping Use: Never used  ?Substance and Sexual Activity  ? Alcohol use: Yes  ?  Comment: socially  ? Drug use: No  ? Sexual activity: Yes  ?  Birth control/protection: Pill  ?Other Topics Concern  ? Not on file  ?Social History Narrative  ? Not on file  ? ?Social Determinants of Health  ? ?Financial Resource Strain: Not on file  ?Food Insecurity: No Food Insecurity  ? Worried About Programme researcher, broadcasting/film/video in the Last Year: Never true  ? Ran Out of Food in the Last Year: Never true  ?Transportation Needs: No Transportation Needs  ? Lack of Transportation (Medical): No  ? Lack of Transportation (Non-Medical): No  ?Physical Activity: Not on file  ?Stress: Not on file  ?Social Connections: Not on file  ?Intimate Partner Violence: Not on file  ? ? ?Family History  ?Problem Relation Age of Onset  ? Healthy Mother   ? Healthy Father   ? ? ?The following portions of the patient's history were reviewed and updated as appropriate: allergies, current medications, past family history, past medical history, past social history, past surgical history and problem list. ? ?Review of Systems ?Pertinent items noted in HPI and remainder of comprehensive ROS otherwise negative. ?  ?  Objective:  ?BP (!) 143/93   Pulse 95   Wt 282 lb 12.8 oz (128.3 kg)   LMP 03/15/2022 (Exact Date)   BMI 45.65 kg/m?  ?CONSTITUTIONAL: Well-developed, well-nourished female in no acute distress.  ?HENT:  Normocephalic, atraumatic, External right and left ear normal. Oropharynx is clear and moist ?EYES: Conjunctivae and EOM are normal. Pupils are equal, round, and reactive to light. No scleral icterus.  ?NECK: Normal range of motion, supple, no masses.  Normal thyroid.  ?SKIN: Skin is warm and dry. No rash noted. Not diaphoretic. No erythema. No pallor. ?NEUROLGIC: Alert and oriented to person, place, and time. Normal reflexes, muscle tone coordination. No cranial nerve deficit noted. ?PSYCHIATRIC: Normal mood and affect. Normal behavior.  Normal judgment and thought content. ?CARDIOVASCULAR: Normal heart rate noted, regular rhythm ?RESPIRATORY: Clear to auscultation bilaterally. Effort and breath sounds normal, no problems with respiration noted. ?BREASTS: Symmetric in size. No masses, skin changes, nipple drainage, or lymphadenopathy. ?ABDOMEN: Soft, normal bowel sounds, no distention noted.  No tenderness, rebound or guarding.  ?PELVIC: Normal appearing external genitalia; normal appearing vaginal mucosa and cervix.  No abnormal discharge noted.  Pap smear obtained.  Normal uterine size, no other palpable masses, no uterine or adnexal tenderness. ?MUSCULOSKELETAL: Normal range of motion. No tenderness.  No cyanosis, clubbing, or edema.  2+ distal pulses. ? ? ?Assessment:  ?Annual gynecologic examination with pap smear ?STD ?Contraception management ?Plan:  ?Will follow up results of pap smear and manage accordingly. ?STD testing as per pt request ?OCP's reviewed with pt.U/R/B and back up method reviewed with pt. To start with next cycle ?Routine preventative health maintenance measures emphasized. ?Please refer to After Visit Summary for other counseling recommendations.  ? ? ?Hermina Staggers, MD, FACOG ?Attending Obstetrician & Gynecologist ?Center for [employer], [facility]  ?

## 2022-03-29 NOTE — Patient Instructions (Signed)

## 2022-03-30 LAB — CYTOLOGY - PAP
Chlamydia: NEGATIVE
Comment: NEGATIVE
Comment: NEGATIVE
Comment: NORMAL
Diagnosis: NEGATIVE
Neisseria Gonorrhea: NEGATIVE
Trichomonas: NEGATIVE

## 2022-03-30 LAB — RPR: RPR Ser Ql: NONREACTIVE

## 2022-03-30 LAB — HEPATITIS C ANTIBODY: Hep C Virus Ab: NONREACTIVE

## 2022-03-30 LAB — HEPATITIS B SURFACE ANTIGEN: Hepatitis B Surface Ag: NEGATIVE

## 2022-03-30 LAB — HIV ANTIBODY (ROUTINE TESTING W REFLEX): HIV Screen 4th Generation wRfx: NONREACTIVE

## 2022-04-27 ENCOUNTER — Ambulatory Visit: Payer: 59 | Admitting: Psychology

## 2022-05-16 ENCOUNTER — Ambulatory Visit: Payer: 59

## 2022-05-24 ENCOUNTER — Ambulatory Visit (INDEPENDENT_AMBULATORY_CARE_PROVIDER_SITE_OTHER): Payer: 59 | Admitting: Psychology

## 2022-05-24 DIAGNOSIS — F4321 Adjustment disorder with depressed mood: Secondary | ICD-10-CM | POA: Diagnosis not present

## 2022-06-09 ENCOUNTER — Other Ambulatory Visit (HOSPITAL_COMMUNITY)
Admission: RE | Admit: 2022-06-09 | Discharge: 2022-06-09 | Disposition: A | Discharge status: Home / Self Care | Payer: 59 | Source: Ambulatory Visit | Attending: Family Medicine | Admitting: Family Medicine

## 2022-06-09 ENCOUNTER — Ambulatory Visit (INDEPENDENT_AMBULATORY_CARE_PROVIDER_SITE_OTHER): Payer: 59

## 2022-06-09 ENCOUNTER — Other Ambulatory Visit: Payer: Self-pay

## 2022-06-09 VITALS — BP 134/89 | HR 87 | Wt 279.8 lb

## 2022-06-09 DIAGNOSIS — N898 Other specified noninflammatory disorders of vagina: Secondary | ICD-10-CM | POA: Insufficient documentation

## 2022-06-09 NOTE — Progress Notes (Signed)
Patient here today with the complaint of vaginal irritation and white vaginal discharge. I instructed patient on how to collect a self swab. Self swab collected without issue. I explained to patient we will notify her with any abnormal results. Patient verbalized understanding and denies any other questions.   Alesia Richards, RN 06/09/22

## 2022-06-09 NOTE — Patient Instructions (Signed)
Non-medication Remedies for Bacterial Vaginosis   Option #1   1 Tbsp Fractitionated Coconut Oil  10 drops of Melaleuca (Tea Tree) Oil   Mix ingredients together well.  Soak 3-4 tampons (in applicators) in that mixture until all or mostly all mixture is soaked up into the tampons.  Insert 1 saturated tampon vaginally and wear overnight for 3-4 nights.   You can purchase Tea Tree Oil locally at:   [employer]  [address]  [place] [address]   [employer]  [address]  [place] [address]      Option #2  Fill tub with enough to cover lap/lower abdomen warm water.  Mix 1/2 cup of baking soda or a few cups of apple cider vinegar into water. Soak in bath for at least 20 minutes. Be sure to swish water in between legs to get as much in vagina as possible. This soak can be done after sexual intercourse and menstrual cycles.      Unscented is best! (and other things to remember) Soap: UNSCENTED Dove (white box light green writing)  Laundry detergent- unscented  Sanitary napkin/panty liners: UNSCENTED.  If it doesn't SAY unscented it can have a scent/perfume    NO PERFUMES OR LOTIONS in the vulvar area Condoms: Non dyed, non flavored.   Remember to change wash cloths and towels frequently to prevent reinfection! Wear loose clothes to sleep.  

## 2022-06-10 LAB — CERVICOVAGINAL ANCILLARY ONLY
Bacterial Vaginitis (gardnerella): POSITIVE — AB
Candida Glabrata: NEGATIVE
Candida Vaginitis: POSITIVE — AB
Chlamydia: NEGATIVE
Comment: NEGATIVE
Comment: NEGATIVE
Comment: NEGATIVE
Comment: NEGATIVE
Comment: NEGATIVE
Comment: NORMAL
Neisseria Gonorrhea: NEGATIVE
Trichomonas: NEGATIVE

## 2022-06-13 ENCOUNTER — Other Ambulatory Visit: Payer: Self-pay | Admitting: General Practice

## 2022-06-13 DIAGNOSIS — B379 Candidiasis, unspecified: Secondary | ICD-10-CM

## 2022-06-13 DIAGNOSIS — B9689 Other specified bacterial agents as the cause of diseases classified elsewhere: Secondary | ICD-10-CM

## 2022-06-13 MED ORDER — FLUCONAZOLE 150 MG PO TABS: 150.0000 mg | ORAL_TABLET | Freq: Once | ORAL | 0 refills | Status: AC

## 2022-06-13 MED ORDER — FLUCONAZOLE 150 MG PO TABS: 150.0000 mg | ORAL_TABLET | Freq: Once | ORAL | 0 refills | Status: DC

## 2022-06-13 MED ORDER — METRONIDAZOLE 500 MG PO TABS: 500.0000 mg | ORAL_TABLET | Freq: Two times a day (BID) | ORAL | 0 refills | Status: DC

## 2022-06-15 ENCOUNTER — Telehealth: Payer: Self-pay | Admitting: Lactation Services

## 2022-06-15 ENCOUNTER — Other Ambulatory Visit: Payer: Self-pay | Admitting: *Deleted

## 2022-06-15 ENCOUNTER — Other Ambulatory Visit: Payer: Self-pay | Admitting: Family Medicine

## 2022-06-15 DIAGNOSIS — Z30011 Encounter for initial prescription of contraceptive pills: Secondary | ICD-10-CM

## 2022-06-15 MED ORDER — NORETHIN ACE-ETH ESTRAD-FE 1-20 MG-MCG(24) PO TABS: 1.0000 | ORAL_TABLET | Freq: Every day | ORAL | 9 refills | Status: DC

## 2022-06-15 MED ORDER — METRONIDAZOLE 0.75 % VA GEL: 1.0000 | Freq: Every day | VAGINAL | 0 refills | Status: DC

## 2022-06-15 NOTE — Telephone Encounter (Signed)
-----   Message from Federico Flake, MD sent at 06/15/2022  2:34 PM EDT ----- BV present. Metronidazole sent.

## 2022-06-15 NOTE — Telephone Encounter (Signed)
Called and spoke with patient and informed her of BV and Diflucan. Patient aware to take Flagyl with food. She took the Flagyl with crackers and is feeling very nauseated and with diarrhea, advised taking with full meal would be preferred.   Patient reports that she is aware that she is to take the Diflucan at the end of the Flagyl treatment.   Reviewed can send in Metrogel, however may not be covered by [employer]. Patient asked for Metrogel to be sent in. Prescription sent.

## 2022-07-10 ENCOUNTER — Other Ambulatory Visit: Payer: Self-pay | Admitting: Obstetrics and Gynecology

## 2022-07-10 DIAGNOSIS — Z30011 Encounter for initial prescription of contraceptive pills: Secondary | ICD-10-CM

## 2022-07-12 ENCOUNTER — Ambulatory Visit (INDEPENDENT_AMBULATORY_CARE_PROVIDER_SITE_OTHER): Payer: Self-pay | Admitting: Psychology

## 2022-07-12 DIAGNOSIS — F4321 Adjustment disorder with depressed mood: Secondary | ICD-10-CM

## 2022-07-12 NOTE — Progress Notes (Signed)
[facility] Counselor/Therapist Progress Note  Patient ID: Brooke Dominguez, MRN: [id number],    Date: 07/12/2022  Time Spent: 11:00am-11:45am     45 minutes  Treatment Type: Individual Therapy  Reported Symptoms:  stress  Mental Status Exam: Appearance:  Casual     Behavior: Appropriate  Motor: Normal  Speech/Language:  Normal Rate  Affect: Appropriate  Mood: normal  Thought process: normal  Thought content:   WNL  Sensory/Perceptual disturbances:   WNL  Orientation: oriented to person, place, and time/date  Attention: Good  Concentration: Good  Memory: WNL  Fund of knowledge:  Good  Insight:   Good  Judgment:  Good  Impulse Control: Good   Risk Assessment: Danger to Self:  No Self-injurious Behavior: No Danger to Others: No Duty to Warn:no Physical Aggression / Violence:No  Access to Firearms a concern: No  Gang Involvement:No   Subjective:  Pt present for face-to-face individual therapy via video Webex.  Pt consents to telehealth video session due to COVID 19 pandemic. Location of pt: home Location of therapist: home office.    Pt talked about work.  It has been busy and stressful.  Worked on Optician, dispensing.    Pt talked about her relationship with her boyfriend.   They have been doing better.   Addressed the interactions.  Helped pt process her feelings and relationship dynamics.    Pt talked about July being a difficult month bc of the one year anniversary of her abortion.   Addressed pt's feelings and the disappointment that some friends were not as supportive as she needed them to be.   Worked on self care strategies.   Provided supportive therapy.     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  (Treatment Plan Target Date: 09/21/2022) Pt is progressing toward treatment goals.   Plan to continue to see pt every two weeks.        Gailene Youkhana, LCSW

## 2022-07-13 ENCOUNTER — Encounter: Payer: Self-pay | Admitting: General Practice

## 2022-08-18 ENCOUNTER — Other Ambulatory Visit (HOSPITAL_COMMUNITY): Payer: Self-pay

## 2022-08-18 MED ORDER — WEGOVY 1 MG/0.5ML ~~LOC~~ SOAJ
1.0000 mg | SUBCUTANEOUS | 0 refills | Status: DC
  Filled 2022-08-18: qty 2, 28d supply, fill #0

## 2022-08-18 MED ORDER — WEGOVY 1.7 MG/0.75ML ~~LOC~~ SOAJ
1.7000 mg | SUBCUTANEOUS | 0 refills | Status: DC
  Filled 2022-08-18: qty 3, 28d supply, fill #0

## 2022-08-18 MED ORDER — WEGOVY 2.4 MG/0.75ML ~~LOC~~ SOAJ
2.4000 mg | SUBCUTANEOUS | 0 refills | Status: DC
  Filled 2022-08-18: qty 3, 28d supply, fill #0

## 2022-08-19 ENCOUNTER — Ambulatory Visit (INDEPENDENT_AMBULATORY_CARE_PROVIDER_SITE_OTHER): Payer: Self-pay | Admitting: Psychology

## 2022-08-19 DIAGNOSIS — F4321 Adjustment disorder with depressed mood: Secondary | ICD-10-CM

## 2022-08-19 NOTE — Progress Notes (Signed)
[facility] Counselor/Therapist Progress Note  Patient ID: Brooke Dominguez, MRN: [id number],    Date: 08/19/2022  Time Spent: 12:00pm-12:50pm    50 minutes  Treatment Type: Individual Therapy  Reported Symptoms:  stress  Mental Status Exam: Appearance:  Casual     Behavior: Appropriate  Motor: Normal  Speech/Language:  Normal Rate  Affect: Appropriate  Mood: normal  Thought process: normal  Thought content:   WNL  Sensory/Perceptual disturbances:   WNL  Orientation: oriented to person, place, and time/date  Attention: Good  Concentration: Good  Memory: WNL  Fund of knowledge:  Good  Insight:   Good  Judgment:  Good  Impulse Control: Good   Risk Assessment: Danger to Self:  No Self-injurious Behavior: No Danger to Others: No Duty to Warn:no Physical Aggression / Violence:No  Access to Firearms a concern: No  Gang Involvement:No   Subjective:  Pt present for face-to-face individual therapy via video Webex.  Pt consents to telehealth video session due to COVID 19 pandemic. Location of pt: home Location of therapist: home office.       Pt talked about feeling sad and tearful a lot the past month and a half.   She is thinking a lot about the babies she aborted.   Pt keeps thinking about the ages and stages they would be at if she had not aborted them.   Helped pt process her feelings and grief.   Worked on self care strategies.   Pt talked about work.  It has been busy and stressful.  Worked on Child psychotherapist.    Pt talked about her relationship with her boyfriend.   He is on vacation with his kids this week.   Pt misses him bc she needs his support now.  Pt is still upset that she has not met his kids or mother even though they have been dating for over 2 years.  Helped pt process her feelings and relationship dynamics. Provided supportive therapy.     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  (Treatment Plan Target Date: 09/21/2022) Pt is progressing toward treatment goals.   Plan to continue to see pt every two weeks.       Shalisha Clausing, LCSW

## 2022-08-23 ENCOUNTER — Other Ambulatory Visit (HOSPITAL_COMMUNITY): Payer: Self-pay

## 2022-08-23 MED ORDER — OZEMPIC (0.25 OR 0.5 MG/DOSE) 2 MG/3ML ~~LOC~~ SOPN
PEN_INJECTOR | SUBCUTANEOUS | 0 refills | Status: AC
  Filled 2022-08-23: qty 3, 42d supply, fill #0

## 2022-09-23 ENCOUNTER — Ambulatory Visit: Payer: Self-pay | Admitting: Psychology

## 2022-11-15 ENCOUNTER — Ambulatory Visit: Payer: Self-pay | Admitting: Psychology

## 2023-04-06 ENCOUNTER — Ambulatory Visit (INDEPENDENT_AMBULATORY_CARE_PROVIDER_SITE_OTHER): Payer: 59

## 2023-04-06 ENCOUNTER — Other Ambulatory Visit (HOSPITAL_COMMUNITY)
Admission: RE | Admit: 2023-04-06 | Discharge: 2023-04-06 | Disposition: A | Discharge status: Home / Self Care | Payer: 59 | Source: Ambulatory Visit | Attending: Certified Nurse Midwife | Admitting: Certified Nurse Midwife

## 2023-04-06 ENCOUNTER — Other Ambulatory Visit: Payer: Self-pay

## 2023-04-06 VITALS — BP 128/83 | HR 79 | Ht 66.0 in | Wt 278.2 lb

## 2023-04-06 DIAGNOSIS — Z711 Person with feared health complaint in whom no diagnosis is made: Secondary | ICD-10-CM

## 2023-04-06 DIAGNOSIS — Z3200 Encounter for pregnancy test, result unknown: Secondary | ICD-10-CM

## 2023-04-06 LAB — POCT PREGNANCY, URINE: Preg Test, Ur: NEGATIVE

## 2023-04-06 NOTE — Progress Notes (Signed)
Possible Pregnancy  Here today for pregnancy confirmation. UPT in office today is negative. Pt reports first positive home UPT on 12/22/22.    Patient would also like STD/STI testing.   Meryl Crutch, RN 04/06/2023  11:03 AM

## 2023-04-07 LAB — RPR: RPR Ser Ql: NONREACTIVE

## 2023-04-07 LAB — CERVICOVAGINAL ANCILLARY ONLY
Bacterial Vaginitis (gardnerella): NEGATIVE
Candida Glabrata: NEGATIVE
Candida Vaginitis: NEGATIVE
Chlamydia: NEGATIVE
Comment: NEGATIVE
Comment: NEGATIVE
Comment: NEGATIVE
Comment: NEGATIVE
Comment: NEGATIVE
Comment: NORMAL
Neisseria Gonorrhea: NEGATIVE
Trichomonas: NEGATIVE

## 2023-04-07 LAB — HEPATITIS C ANTIBODY: Hep C Virus Ab: NONREACTIVE

## 2023-04-07 LAB — HEPATITIS B SURFACE ANTIGEN: Hepatitis B Surface Ag: NEGATIVE

## 2023-04-07 LAB — HIV ANTIBODY (ROUTINE TESTING W REFLEX): HIV Screen 4th Generation wRfx: NONREACTIVE

## 2023-05-17 ENCOUNTER — Other Ambulatory Visit: Payer: Self-pay | Admitting: Obstetrics and Gynecology

## 2023-05-17 DIAGNOSIS — Z30011 Encounter for initial prescription of contraceptive pills: Secondary | ICD-10-CM

## 2023-08-21 ENCOUNTER — Other Ambulatory Visit: Payer: Self-pay | Admitting: Obstetrics and Gynecology

## 2023-08-21 DIAGNOSIS — Z30011 Encounter for initial prescription of contraceptive pills: Secondary | ICD-10-CM

## 2023-12-26 ENCOUNTER — Emergency Department (HOSPITAL_BASED_OUTPATIENT_CLINIC_OR_DEPARTMENT_OTHER)
Admission: EM | Admit: 2023-12-26 | Discharge: 2023-12-26 | Disposition: A | Discharge status: Home / Self Care | Payer: Self-pay | Attending: Emergency Medicine | Admitting: Emergency Medicine

## 2023-12-26 ENCOUNTER — Other Ambulatory Visit: Payer: Self-pay

## 2023-12-26 ENCOUNTER — Other Ambulatory Visit (HOSPITAL_BASED_OUTPATIENT_CLINIC_OR_DEPARTMENT_OTHER): Payer: Self-pay

## 2023-12-26 DIAGNOSIS — U071 COVID-19: Secondary | ICD-10-CM | POA: Insufficient documentation

## 2023-12-26 LAB — URINALYSIS, ROUTINE W REFLEX MICROSCOPIC
Bilirubin Urine: NEGATIVE
Glucose, UA: NEGATIVE mg/dL
Hgb urine dipstick: NEGATIVE
Ketones, ur: NEGATIVE mg/dL
Leukocytes,Ua: NEGATIVE
Nitrite: NEGATIVE
Specific Gravity, Urine: 1.03 (ref 1.005–1.030)
pH: 7 (ref 5.0–8.0)

## 2023-12-26 LAB — COMPREHENSIVE METABOLIC PANEL
ALT: 15 U/L (ref 0–44)
AST: 16 U/L (ref 15–41)
Albumin: 4.1 g/dL (ref 3.5–5.0)
Alkaline Phosphatase: 68 U/L (ref 38–126)
Anion gap: 10 (ref 5–15)
BUN: 9 mg/dL (ref 6–20)
CO2: 22 mmol/L (ref 22–32)
Calcium: 9.4 mg/dL (ref 8.9–10.3)
Chloride: 106 mmol/L (ref 98–111)
Creatinine, Ser: 0.61 mg/dL (ref 0.44–1.00)
GFR, Estimated: >60 mL/min (ref >60)
Glucose, Bld: 107 mg/dL — ABNORMAL HIGH (ref 70–99)
Potassium: 3.7 mmol/L (ref 3.5–5.1)
Sodium: 138 mmol/L (ref 135–145)
Total Bilirubin: 0.5 mg/dL (ref 0.0–1.2)
Total Protein: 8 g/dL (ref 6.5–8.1)

## 2023-12-26 LAB — RESP PANEL BY RT-PCR (RSV, FLU A&B, COVID)  RVPGX2
Influenza A by PCR: NEGATIVE
Influenza B by PCR: NEGATIVE
Resp Syncytial Virus by PCR: NEGATIVE
SARS Coronavirus 2 by RT PCR: POSITIVE — AB

## 2023-12-26 LAB — CBC
HCT: 41.1 % (ref 36.0–46.0)
Hemoglobin: 13.5 g/dL (ref 12.0–15.0)
MCH: 28.1 pg (ref 26.0–34.0)
MCHC: 32.8 g/dL (ref 30.0–36.0)
MCV: 85.4 fL (ref 80.0–100.0)
Platelets: 368 10*3/uL (ref 150–400)
RBC: 4.81 MIL/uL (ref 3.87–5.11)
RDW: 12.7 % (ref 11.5–15.5)
WBC: 7.6 10*3/uL (ref 4.0–10.5)
nRBC: 0 % (ref 0.0–0.2)

## 2023-12-26 LAB — LIPASE, BLOOD: Lipase: 14 U/L (ref 11–51)

## 2023-12-26 LAB — PREGNANCY, URINE: Preg Test, Ur: NEGATIVE

## 2023-12-26 MED ORDER — LOPERAMIDE HCL 2 MG PO CAPS
2.0000 mg | ORAL_CAPSULE | Freq: Four times a day (QID) | ORAL | 0 refills | Status: DC | PRN
  Filled 2023-12-26: qty 12, 3d supply, fill #0

## 2023-12-26 MED ORDER — ONDANSETRON HCL 4 MG PO TABS
4.0000 mg | ORAL_TABLET | Freq: Four times a day (QID) | ORAL | 0 refills | Status: DC
  Filled 2023-12-26: qty 12, 9d supply, fill #0

## 2023-12-26 MED ORDER — ONDANSETRON 4 MG PO TBDP
4.0000 mg | ORAL_TABLET | Freq: Once | ORAL | Status: AC | PRN
  Administered 2023-12-26: 4 mg via ORAL
  Filled 2023-12-26: qty 1

## 2023-12-26 NOTE — ED Notes (Signed)
Apple juice given.

## 2023-12-26 NOTE — ED Provider Notes (Signed)
[facility] AT [facility] Provider Note   CSN: [id number] Arrival date & time: 12/26/23  1306     History  No chief complaint on file.   Brooke Dominguez is a 28 y.o. female presents today for nausea, vomiting, diarrhea that began this morning.  Patient did have upper respiratory symptoms last week which have since resolved.  Patient denies fever.  Patient endorses NBNB emesis, body aches, watery diarrhea without blood, and some residual congestion from URI last week.  Patient denies weakness, abdominal pain, urinary symptoms, chest pain or shortness of breath. HPI     Home Medications Prior to Admission medications   Medication Sig Start Date End Date Taking? Authorizing Provider  loperamide (IMODIUM) 2 MG capsule Take 1 capsule (2 mg total) by mouth 4 (four) times daily as needed for diarrhea or loose stools. 12/26/23  Yes Dolphus Jenny, PA-C  ondansetron (ZOFRAN) 4 MG tablet Take 1 tablet (4 mg total) by mouth every 6 (six) hours. 12/26/23  Yes Dolphus Jenny, PA-C  albuterol (VENTOLIN HFA) 108 (90 Base) MCG/ACT inhaler Inhale 2 puffs into the lungs every 6 (six) hours as needed for wheezing. 03/06/20   [provider]  HAILEY 24 FE 1-20 MG-MCG(24) tablet TAKE 1 TABLET BY MOUTH EVERY DAY 08/21/23   Hermina Staggers, MD  hydrocortisone ointment 0.5 % Apply 1 Application topically 2 (two) times daily.    [provider]  norethindrone-ethinyl estradiol (MICROGESTIN,JUNEL,LOESTRIN) 1-20 MG-MCG tablet Take 1 tablet by mouth daily. Patient not taking: No sig reported  03/17/21  [provider]      Allergies    Pineapple, Nickel, and Other    Review of Systems   Review of Systems  HENT:  Positive for congestion.   Gastrointestinal:  Positive for diarrhea, nausea and vomiting.    Physical Exam Updated Vital Signs BP (!) 152/87   Pulse (!) 110   Temp 98.7 F (37.1 C) (Oral)   Resp 18   Ht 5\' 6"  (1.676 m)   Wt 131.5 kg    SpO2 100%   BMI 46.81 kg/m  Physical Exam Vitals and nursing note reviewed.  Constitutional:      General: She is not in acute distress.    Appearance: She is well-developed. She is obese. She is ill-appearing.  HENT:     Head: Normocephalic and atraumatic.     Right Ear: External ear normal.     Left Ear: External ear normal.     Nose: Congestion present.     Mouth/Throat:     Mouth: Mucous membranes are moist.  Eyes:     Extraocular Movements: Extraocular movements intact.     Conjunctiva/sclera: Conjunctivae normal.  Cardiovascular:     Rate and Rhythm: Normal rate and regular rhythm.     Pulses: Normal pulses.     Heart sounds: Normal heart sounds. No murmur heard. Pulmonary:     Effort: Pulmonary effort is normal. No respiratory distress.     Breath sounds: Normal breath sounds.  Abdominal:     General: Bowel sounds are normal.     Palpations: Abdomen is soft.     Tenderness: There is no abdominal tenderness.  Musculoskeletal:        General: No swelling.     Cervical back: Neck supple.  Skin:    General: Skin is warm and dry.     Capillary Refill: Capillary refill takes less than 2 seconds.  Neurological:  General: No focal deficit present.     Mental Status: She is alert.     Motor: No weakness.  Psychiatric:        Mood and Affect: Mood normal.     ED Results / Procedures / Treatments   Labs (all labs ordered are listed, but only abnormal results are displayed) Labs Reviewed  RESP PANEL BY RT-PCR (RSV, FLU A&B, COVID)  RVPGX2 - Abnormal; Notable for the following components:      Result Value   SARS Coronavirus 2 by RT PCR POSITIVE (*)    All other components within normal limits  COMPREHENSIVE METABOLIC PANEL - Abnormal; Notable for the following components:   Glucose, Bld 107 (*)    All other components within normal limits  URINALYSIS, ROUTINE W REFLEX MICROSCOPIC - Abnormal; Notable for the following components:   Protein, ur TRACE (*)    All  other components within normal limits  LIPASE, BLOOD  CBC  PREGNANCY, URINE    EKG None  Radiology No results found.  Procedures Procedures    Medications Ordered in ED Medications  ondansetron (ZOFRAN-ODT) disintegrating tablet 4 mg (4 mg Oral Given 12/26/23 1405)    ED Course/ Medical Decision Making/ A&P                                 Medical Decision Making Amount and/or Complexity of Data Reviewed Labs: ordered.  Risk Prescription drug management.   This patient presents to the ED with chief complaint(s) of nausea, vomiting, diarrhea with pertinent past medical history of none which further complicates the presenting complaint. The complaint involves an extensive differential diagnosis and also carries with it a high risk of complications and morbidity.    The differential diagnosis includes COVID, flu, RSV, URI, GI illness, appendicitis, choledocholithiasis  Additional history obtained: Records reviewed Primary Care Documents  ED Course and Reassessment: Patient able to tolerate p.o. intake without difficulty  Independent labs interpretation:  The following labs were independently interpreted:  CBC: No notable findings CMP: No notable findings Lipase: 14 Urine pregnancy: Negative UA: Protein Respiratory panel: Positive for COVID   Consultation: - Consulted or discussed management/test interpretation w/ external professional: None  Consideration for admission or further workup: Consider for admission or further workup however patient's vital signs, physical exam, and labs are reassuring.  Patient symptoms likely due to COVID infection.  Patient given outpatient course of loperamide for for diarrhea and Zofran for nausea.  Patient able to tolerate p.o. intake prior to discharge.  Patient should follow-up with primary care physician if her symptoms persist for further evaluation and treatment.         Final Clinical Impression(s) / ED  Diagnoses Final diagnoses:  COVID    Rx / DC Orders ED Discharge Orders          Ordered    ondansetron (ZOFRAN) 4 MG tablet  Every 6 hours        12/26/23 1638    loperamide (IMODIUM) 2 MG capsule  4 times daily PRN        12/26/23 1638              Dolphus Jenny, PA-C 12/26/23 1639    Glyn Ade, MD 01/02/24 1458

## 2023-12-26 NOTE — Discharge Instructions (Signed)
Today you are seen for COVID.  Please pick up your medications and take as prescribed.  Please try to maintain hydration. Thank you for letting us treat you today. After performing a physical exam and reviewing your labs, I feel you are safe to go home. Please follow up with your PCP in the next several days and provide them with your records from this visit. Return to the Emergency Room if pain becomes severe or symptoms worsen.

## 2023-12-26 NOTE — ED Triage Notes (Signed)
Resp symptoms last week  N/V/D started this AM. Resp symptoms have resolved. Afebrile.

## 2024-01-26 ENCOUNTER — Telehealth: Payer: Self-pay

## 2024-01-26 NOTE — Telephone Encounter (Signed)
 VM left requesting birth control refill. Last pill taken on Tuesday, 01/23/24.

## 2024-01-29 ENCOUNTER — Other Ambulatory Visit: Payer: Self-pay | Admitting: Lactation Services

## 2024-01-29 DIAGNOSIS — Z30011 Encounter for initial prescription of contraceptive pills: Secondary | ICD-10-CM

## 2024-01-29 MED ORDER — HAILEY 24 FE 1-20 MG-MCG(24) PO TABS: 1.0000 | ORAL_TABLET | Freq: Every day | ORAL | 1 refills | Status: DC

## 2024-02-02 NOTE — Telephone Encounter (Signed)
 Birth control refilled by Jasmine December, Charity fundraiser. Needs annual exam; last provider visit 03/29/22. Called pt to review; VM left stating need to call back to schedule annual exam. MyChart message sent.

## 2024-02-23 ENCOUNTER — Other Ambulatory Visit: Payer: Self-pay | Admitting: Family Medicine

## 2024-02-23 DIAGNOSIS — Z30011 Encounter for initial prescription of contraceptive pills: Secondary | ICD-10-CM

## 2024-02-28 ENCOUNTER — Telehealth: Payer: Self-pay

## 2024-02-28 NOTE — Telephone Encounter (Signed)
 Pt left message stating that she was upset that no called her to inform her about the appt that was scheduled and she is working.  Pt states that she can be scheduled on Tuesday the 8th and the 11th.  If someone can call her back.  Message routed to the front office.   Brooke Dominguez

## 2024-03-07 ENCOUNTER — Ambulatory Visit: Payer: Self-pay | Admitting: Obstetrics and Gynecology

## 2024-03-07 ENCOUNTER — Encounter: Payer: Self-pay | Admitting: Obstetrics and Gynecology

## 2024-03-07 ENCOUNTER — Ambulatory Visit: Admitting: Obstetrics and Gynecology

## 2024-03-07 ENCOUNTER — Other Ambulatory Visit (HOSPITAL_COMMUNITY)
Admission: RE | Admit: 2024-03-07 | Discharge: 2024-03-07 | Disposition: A | Discharge status: Home / Self Care | Source: Ambulatory Visit | Attending: Obstetrics and Gynecology | Admitting: Obstetrics and Gynecology

## 2024-03-07 ENCOUNTER — Other Ambulatory Visit: Payer: Self-pay

## 2024-03-07 VITALS — BP 131/85 | HR 99 | Wt 306.1 lb

## 2024-03-07 DIAGNOSIS — Z6841 Body Mass Index (BMI) 40.0 and over, adult: Secondary | ICD-10-CM

## 2024-03-07 DIAGNOSIS — Z1331 Encounter for screening for depression: Secondary | ICD-10-CM | POA: Diagnosis not present

## 2024-03-07 DIAGNOSIS — Z01411 Encounter for gynecological examination (general) (routine) with abnormal findings: Secondary | ICD-10-CM

## 2024-03-07 DIAGNOSIS — Z30011 Encounter for initial prescription of contraceptive pills: Secondary | ICD-10-CM

## 2024-03-07 DIAGNOSIS — Z113 Encounter for screening for infections with a predominantly sexual mode of transmission: Secondary | ICD-10-CM | POA: Insufficient documentation

## 2024-03-07 DIAGNOSIS — E66813 Obesity, class 3: Secondary | ICD-10-CM

## 2024-03-07 DIAGNOSIS — Z01419 Encounter for gynecological examination (general) (routine) without abnormal findings: Secondary | ICD-10-CM

## 2024-03-07 MED ORDER — HAILEY 24 FE 1-20 MG-MCG(24) PO TABS: 1.0000 | ORAL_TABLET | Freq: Every day | ORAL | 4 refills | Status: AC

## 2024-03-07 NOTE — Progress Notes (Signed)
 ANNUAL EXAM Patient name: Brooke Dominguez MRN [id number]  Date of birth: [birth year] Chief Complaint:   well woman  History of Present Illness:   Brooke Dominguez is a 28 y.o. G1P0010 being seen today for a routine annual exam.  Current complaints: annual  Menstrual concerns? No   Breast or nipple changes? No  Contraception use? OCPs - doing well with them Sexually active? Yes female partner, no pain with intercourse  Weight is a concern - has gained weight but feels she eats fairly healthy. Cooks a lot of her meals. Knows she should increased her exercise frequency.   Patient's last menstrual period was 02/20/2024.   Upstream - 03/07/24 1531       Pregnancy Intention Screening   Does the patient want to become pregnant in the next year? Yes    Does the patient's partner want to become pregnant in the next year? No    Would the patient like to discuss contraceptive options today? Yes      Contraception Wrap Up   Current Method Oral Contraceptive            The pregnancy intention screening data noted above was reviewed. Potential methods of contraception were discussed. The patient elected to proceed with No data recorded.   Last pap     Component Value Date/Time   DIAGPAP  03/29/2022 0925    - Negative for intraepithelial lesion or malignancy (NILM)   ADEQPAP  03/29/2022 0925    Satisfactory for evaluation; transformation zone component PRESENT.    Last mammogram: n/a.  Last colonoscopy: n/a.      03/29/2022    4:54 PM 03/29/2022    9:00 AM  Depression screen PHQ 2/9  Decreased Interest 1 1  Down, Depressed, Hopeless 1 1  PHQ - 2 Score 2 2  Altered sleeping 2 2  Tired, decreased energy 1 1  Change in appetite 2 2  Feeling bad or failure about yourself  1 1  Trouble concentrating 0 0  Moving slowly or fidgety/restless 0 0  Suicidal thoughts 0 0  PHQ-9 Score 8 8        03/29/2022    4:54 PM 03/29/2022    9:00 AM  GAD 7 : Generalized Anxiety Score  Nervous,  Anxious, on Edge 1 1  Control/stop worrying 0 0  Worry too much - different things 1 1  Trouble relaxing 1 1  Restless 0 0  Easily annoyed or irritable 1 1  Afraid - awful might happen 0 0  Total GAD 7 Score 4 4     Review of Systems:   Pertinent items are noted in HPI Denies any headaches, blurred vision, fatigue, shortness of breath, chest pain, abdominal pain, abnormal vaginal discharge/itching/odor/irritation, problems with periods, bowel movements, urination, or intercourse unless otherwise stated above. Pertinent History Reviewed:  Reviewed past medical,surgical, social and family history.  Reviewed problem list, medications and allergies. Physical Assessment:   Vitals:   03/07/24 1528  BP: 131/85  Pulse: 99  Weight: (!) 306 lb 1.6 oz (138.8 kg)  Body mass index is 49.41 kg/m.        Physical Examination:   General appearance - well appearing, and in no distress  Mental status - alert, oriented to person, place, and time  Psych:  She has a normal mood and affect  Skin - warm and dry, normal color, no suspicious lesions noted  Chest - effort normal, all lung fields clear to auscultation bilaterally  Heart - normal rate and regular rhythm  Breasts - breasts appear normal, no suspicious masses, no skin or nipple changes or  axillary nodes  Abdomen - soft, nontender, nondistended, no masses or organomegaly  Pelvic -  VULVA: normal appearing vulva with no masses, tenderness or lesions   VAGINA: normal appearing vagina with normal color and discharge, no lesions   CERVIX: normal appearing cervix without discharge or lesions, no CMT  UTERUS: uterus is felt to be normal size, shape, consistency and nontender   ADNEXA: No adnexal masses or tenderness noted.  Extremities:  No swelling or varicosities noted  Chaperone present for exam  No results found for this or any previous visit (from the past 24 hours).    Assessment & Plan:   1. Well woman exam with routine  gynecological exam (Primary) - Cervical cancer screening: Discussed screening Q3 years. Reviewed importance of annual exams and limits of pap smear. Pap with reflex HPV due  - GC/CT: Discussed and recommended. Pt  accepts - Birth Control: OCPs - Breast Health: Encouraged self breast awareness/exams.  - Follow-up: 12 months and prn  - Cervicovaginal ancillary only - RPR+HBsAg+HCVAb+...  2. Screening examination for STI - Cervicovaginal ancillary only - RPR+HBsAg+HCVAb+...  3. Encounter for initial prescription of contraceptive pills Will continue OCP for now.  - Norethindrone Acetate-Ethinyl Estrad-FE (HAILEY 24 FE) 1-20 MG-MCG(24) tablet; Take 1 tablet by mouth daily.  Dispense: 90 tablet; Refill: 4  4. Class 3 severe obesity due to excess calories without serious comorbidity with body mass index (BMI) of 45.0 to 49.9 in adult [facility])  - Referral to Nutrition and Diabetes Services   Orders Placed This Encounter  Procedures   RPR+HBsAg+HCVAb+...   Referral to Nutrition and Diabetes Services    Meds:  Meds ordered this encounter  Medications   Norethindrone Acetate-Ethinyl Estrad-FE (HAILEY 24 FE) 1-20 MG-MCG(24) tablet    Sig: Take 1 tablet by mouth daily.    Dispense:  90 tablet    Refill:  4    Follow-up: No follow-ups on file.  Kiki Pelton, MD 03/07/2024 3:34 PM

## 2024-03-08 ENCOUNTER — Encounter: Payer: Self-pay | Admitting: Obstetrics and Gynecology

## 2024-03-08 LAB — RPR+HBSAG+HCVAB+...
HIV Screen 4th Generation wRfx: NONREACTIVE
Hep C Virus Ab: NONREACTIVE
Hepatitis B Surface Ag: NEGATIVE
RPR Ser Ql: NONREACTIVE

## 2024-03-12 LAB — CERVICOVAGINAL ANCILLARY ONLY
Chlamydia: NEGATIVE
Comment: NEGATIVE
Comment: NEGATIVE
Comment: NORMAL
Neisseria Gonorrhea: NEGATIVE
Trichomonas: NEGATIVE

## 2024-04-16 ENCOUNTER — Ambulatory Visit: Payer: Self-pay | Admitting: Obstetrics and Gynecology

## 2024-04-30 ENCOUNTER — Encounter: Attending: Obstetrics and Gynecology | Admitting: Dietician

## 2024-04-30 ENCOUNTER — Encounter: Payer: Self-pay | Admitting: Dietician

## 2024-04-30 VITALS — Wt 303.0 lb

## 2024-04-30 DIAGNOSIS — E66813 Obesity, class 3: Secondary | ICD-10-CM | POA: Insufficient documentation

## 2024-04-30 NOTE — Progress Notes (Signed)
 Medical Nutrition Therapy  Appointment Start time:  [phone]  Appointment End time:  0950  Primary concerns today: healthy lifestyle and weight loss   Referral diagnosis: E66.813 Preferred learning style: no preference indicated Learning readiness: ready   NUTRITION ASSESSMENT   Anthropometrics   Wt: 303 lb  Clinical Medical Hx: asthma,  Medications: reviewed Labs: reviewed Notable Signs/Symptoms: none reported Food Allergies: pineapple  Lifestyle & Dietary Hx  Pt reports she wants to try to eat healthier. Pt reports she enjoys cooking, lives alone, and does her grocery shopping. 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   Pt reports she started making more lifestyle changes in April. Pt states before this she was eating out more. Pt reports about a year ago she had been consistent with the gym about 3-4 days per week, but fell out of the habit about a year ago. Pt reports she wants to get back into the gym.   Pt reports she is not usually hungry for breakfast but has been trying eat it. Pt states when she was in 4th grade her mom would make her eat breakfast but it would make her nauseated.   Pt reports she was on ozempic  for 8 weeks and lost 12 lb, but gained the weight back after stopping.   Estimated daily fluid intake: 48 oz Supplements: prebiotic/probiotic, ashwaganda Sleep: unable to assess Stress / self-care: moderate Current average weekly physical activity: ADLs  24-Hr Dietary Recall First Meal: [place] and egg OR smoothie: banana, strawberries, greek yogurt, honey, vanilla almond milk OR skips Snack: none Second Meal: 2pm: leftovers Snack: none Third Meal: ground [place] spaghetti (WW pasta) OR mozzarella sticks Snack: none Beverages: 48 oz water, celcius or red bull,    NUTRITION DIAGNOSIS   NB-1.1 Food and nutrition-related knowledge deficit As related to lack of prior education by a registered dietitian.  As evidenced by pt report.   NUTRITION INTERVENTION  Nutrition education (E-1) on the following topics:   Exercise Aim for 150 minutes of physical activity weekly. Make physical activity a part of your week. Try to include at least 30 minutes of physical activity 5 days each week or at least 150 minutes per week. Regular physical activity promotes overall health-including helping to reduce risk for heart disease and diabetes, promoting mental health, and helping us  sleep better.     Plate Method Fruits & Vegetables: Aim to fill half your plate with a variety of fruits and vegetables. They are rich in vitamins, minerals, and fiber, and can help reduce the risk of chronic diseases. Choose a colorful assortment of fruits and vegetables to ensure you get a wide range of nutrients. Grains and Starches: Make at least half of your grain choices whole grains, such as brown rice, whole wheat bread, and oats. Whole grains provide fiber, which aids in digestion and healthy cholesterol levels. Aim for whole forms of starchy vegetables such as potatoes, sweet potatoes, beans, peas, and corn, which are fiber rich and provide many vitamins and minerals.  Protein: Incorporate lean sources of protein, such as poultry, fish, beans, nuts, and seeds, into your meals. Protein is essential for building and repairing tissues, staying full, balancing blood sugar, as well as supporting immune function. Dairy: Include low-fat or fat-free dairy products like milk, yogurt, and cheese in your diet. Dairy foods  are excellent sources of calcium and vitamin D, which are crucial for bone health.   Body Acceptance Accepting your body as it is today is a crucial first step before starting any weight loss journey. Embracing body acceptance fosters kindness and patience, helping you build a healthier, more sustainable  relationship with food and yourself. Instead of focusing solely on the scale, tune into how your body feels and prioritize nourishing it with balanced meals that support your energy and wellbeing. Set goals that celebrate strength, stamina, and overall health rather than just weight. Remember, health is more than a number--it includes mental, emotional, and physical wellness. Treat yourself with compassion and celebrate every step of progress along the way.   Handouts Provided Include  Plate Method  Learning Style & Readiness for Change Teaching method utilized: Visual & Auditory  Demonstrated degree of understanding via: Teach Back  Barriers to learning/adherence to lifestyle change: none  Goals Established by Pt  Goal 1: go to the gym 2 times per week.  Goal 2: eat at least 1 serving fruit and 1 serving of non-starchy vegetables every day.   Other Tips:   At meals, aim to include 1/2 plate non-starchy vegetables, 1/4 plate protein, and 1/4 plate complex carbs.    MONITORING & EVALUATION Dietary intake, weekly physical activity, and follow up in 6 weeks.  Next Steps  Patient is to call for questions.

## 2024-04-30 NOTE — Patient Instructions (Signed)
 Goals Established by Pt  Goal 1: go to the gym 2 times per week.  Goal 2: eat at least 1 serving fruit and 1 serving of non-starchy vegetables every day.   Other Tips:   At meals, aim to include 1/2 plate non-starchy vegetables, 1/4 plate protein, and 1/4 plate complex carbs.

## 2024-06-11 ENCOUNTER — Ambulatory Visit: Admitting: Dietician

## 2024-10-19 ENCOUNTER — Ambulatory Visit (HOSPITAL_COMMUNITY)
Admission: EM | Admit: 2024-10-19 | Discharge: 2024-10-19 | Disposition: A | Discharge status: Home / Self Care | Attending: Emergency Medicine | Admitting: Emergency Medicine

## 2024-10-19 ENCOUNTER — Encounter (HOSPITAL_COMMUNITY): Payer: Self-pay | Admitting: Emergency Medicine

## 2024-10-19 DIAGNOSIS — H6593 Unspecified nonsuppurative otitis media, bilateral: Secondary | ICD-10-CM | POA: Diagnosis not present

## 2024-10-19 DIAGNOSIS — J329 Chronic sinusitis, unspecified: Secondary | ICD-10-CM | POA: Diagnosis not present

## 2024-10-19 DIAGNOSIS — B9689 Other specified bacterial agents as the cause of diseases classified elsewhere: Secondary | ICD-10-CM | POA: Diagnosis not present

## 2024-10-19 DIAGNOSIS — J4521 Mild intermittent asthma with (acute) exacerbation: Secondary | ICD-10-CM

## 2024-10-19 MED ORDER — ALBUTEROL SULFATE HFA 108 (90 BASE) MCG/ACT IN AERS: 1.0000 | INHALATION_SPRAY | Freq: Four times a day (QID) | RESPIRATORY_TRACT | 0 refills | Status: AC | PRN

## 2024-10-19 MED ORDER — FLUCONAZOLE 150 MG PO TABS
150.0000 mg | ORAL_TABLET | Freq: Every day | ORAL | 0 refills | Status: AC
Start: 2024-10-19 — End: ?

## 2024-10-19 MED ORDER — PREDNISONE 20 MG PO TABS: 40.0000 mg | ORAL_TABLET | Freq: Every day | ORAL | 0 refills | Status: AC

## 2024-10-19 MED ORDER — AMOXICILLIN-POT CLAVULANATE 875-125 MG PO TABS: 1.0000 | ORAL_TABLET | Freq: Two times a day (BID) | ORAL | 0 refills | Status: AC

## 2024-10-19 NOTE — ED Triage Notes (Signed)
 Pt reports since October 24 she has had a cold. Reports cough that is getting up brown-green phlegm. Was taking blue nighttime gel pills that my boyfriend got me and nothing during the day until Monday I got some [facility] cough syrup. Pt has chest pain when coughs. Pt has hx asthma. Pt adds that her right ear has fullness and causing hard of hearing.

## 2024-10-19 NOTE — ED Provider Notes (Signed)
 [facility]    CSN: [id number] Arrival date & time: 10/19/24  1510      History   Chief Complaint Chief Complaint  Patient presents with   Cough    HPI Brooke Dominguez is a 28 y.o. female.   Patient presents to clinic over concern of ongoing nasal congestion, rhinorrhea, sinus pressure and a cough with postnasal drip.  She has had wheezing and shortness of breath.  Does have a history of asthma and has been using her albuterol  inhaler, which is expired.  Also reports right ear discomfort, thinks she may be getting an ear infection.  Central chest pain with coughing.  Feels like splinting her chest helps with the chest pain.  Is on the phone for work, will find at the end of the day she is hoarse.  She is not allowed to leave the camera, does work from home.  Will find at the end of the day she has a spit bottle that is third of the way full.  Has not had fever or fatigue.  Symptoms have been ongoing since October 24.  The history is provided by the patient and medical records.  Cough   Past Medical History:  Diagnosis Date   Asthma    Headache     Patient Active Problem List   Diagnosis Date Noted   Visit for routine gyn exam 03/29/2022   STD exposure 03/29/2022   Contraceptive management 03/29/2022    Past Surgical History:  Procedure Laterality Date   DILATION AND CURETTAGE OF UTERUS  2022   induced abortion   NO PAST SURGERIES      OB History     Gravida  1   Para      Term      Preterm      AB  1   Living         SAB      IAB  1   Ectopic      Multiple      Live Births  0            Home Medications    Prior to Admission medications   Medication Sig Start Date End Date Taking? Authorizing Provider  albuterol  (VENTOLIN  HFA) 108 (90 Base) MCG/ACT inhaler Inhale 1-2 puffs into the lungs every 6 (six) hours as needed for wheezing or shortness of breath. 10/19/24  Yes Braxton Vantrease  N, FNP  amoxicillin -clavulanate  (AUGMENTIN ) 875-125 MG tablet Take 1 tablet by mouth every 12 (twelve) hours. 10/19/24  Yes Deontra Pereyra  N, FNP  fluconazole  (DIFLUCAN ) 150 MG tablet Take 1 tablet (150 mg total) by mouth daily. 10/19/24  Yes Niguel Moure  N, FNP  predniSONE  (DELTASONE ) 20 MG tablet Take 2 tablets (40 mg total) by mouth daily for 5 days. 10/19/24 10/24/24 Yes Jestin Burbach  N, FNP  albuterol  (VENTOLIN  HFA) 108 (90 Base) MCG/ACT inhaler Inhale 2 puffs into the lungs every 6 (six) hours as needed for wheezing. 03/06/20   [provider]  Norethindrone Acetate-Ethinyl Estrad-FE (HAILEY  24 FE) 1-20 MG-MCG(24) tablet Take 1 tablet by mouth daily. 03/07/24   Ajewole, Christana, MD  norethindrone-ethinyl estradiol (MICROGESTIN,JUNEL,LOESTRIN) 1-20 MG-MCG tablet Take 1 tablet by mouth daily. Patient not taking: No sig reported  03/17/21  [provider]    Family History Family History  Problem Relation Age of Onset   Healthy Father    Diabetes Mother    Healthy Mother     Social History Social History  Tobacco Use   Smoking status: Never   Smokeless tobacco: Never  Vaping Use   Vaping status: Some Days   Start date: 11/29/2019  Substance Use Topics   Alcohol use: Yes    Comment: occassionaly   Drug use: No     Allergies   Pineapple, Nickel, and Other   Review of Systems Review of Systems  Per HPI  Physical Exam Triage Vital Signs ED Triage Vitals  Encounter Vitals Group     BP 10/19/24 1721 137/87     Girls Systolic BP Percentile --      Girls Diastolic BP Percentile --      Boys Systolic BP Percentile --      Boys Diastolic BP Percentile --      Pulse Rate 10/19/24 1721 86     Resp 10/19/24 1721 17     Temp 10/19/24 1721 98.7 F (37.1 C)     Temp Source 10/19/24 1721 Oral     SpO2 10/19/24 1721 98 %     Weight --      Height --      Head Circumference --      Peak Flow --      Pain Score 10/19/24 1722 0     Pain Loc --      Pain Education --       Exclude from Growth Chart --    No data found.  Updated Vital Signs BP 137/87 (BP Location: Left Arm)   Pulse 86   Temp 98.7 F (37.1 C) (Oral)   Resp 17   SpO2 98%   Visual Acuity Right Eye Distance:   Left Eye Distance:   Bilateral Distance:    Right Eye Near:   Left Eye Near:    Bilateral Near:     Physical Exam Vitals and nursing note reviewed.  Constitutional:      Appearance: Normal appearance.  HENT:     Head: Normocephalic and atraumatic.     Right Ear: Ear canal and external ear normal. A middle ear effusion is present.     Left Ear: Ear canal and external ear normal. A middle ear effusion is present.     Nose: Congestion and rhinorrhea present.     Mouth/Throat:     Mouth: Mucous membranes are moist.     Pharynx: Posterior oropharyngeal erythema present.  Cardiovascular:     Rate and Rhythm: Normal rate and regular rhythm.     Heart sounds: Normal heart sounds. No murmur heard. Pulmonary:     Effort: Pulmonary effort is normal. No respiratory distress.     Breath sounds: Normal breath sounds. No wheezing.  Skin:    General: Skin is warm and dry.  Neurological:     General: No focal deficit present.     Mental Status: She is alert and oriented to person, place, and time.  Psychiatric:        Mood and Affect: Mood normal.        Behavior: Behavior normal. Behavior is cooperative.      UC Treatments / Results  Labs (all labs ordered are listed, but only abnormal results are displayed) Labs Reviewed - No data to display  EKG   Radiology No results found.  Procedures Procedures (including critical care time)  Medications Ordered in UC Medications - No data to display  Initial Impression / Assessment and Plan / UC Course  I have reviewed the triage vital signs and the nursing notes.  Pertinent  labs & imaging results that were available during my care of the patient were reviewed by me and considered in my medical decision making (see chart  for details).  Vitals in triage reviewed, patient is hemodynamically stable.  Lungs vesicular, heart with regular rate and rhythm.  Maxillary sinus tenderness.  Bilateral middle ear effusions.  Postnasal drip and posterior pharynx erythema present.  Due to duration of symptoms will cover with Augmentin  for acute bacterial sinusitis.  Prednisone  burst sent in for mild asthma exacerbation, refilled albuterol  inhaler as well.  Prednisone  should help with your pain as well.  Plan of care, follow-up care return precautions given, no questions at this time.  Work note provided.     Final Clinical Impressions(s) / UC Diagnoses   Final diagnoses:  Bacterial sinusitis  Fluid level behind tympanic membrane of both ears  Mild intermittent asthma with acute exacerbation     Discharge Instructions      Take the antibiotics twice daily with food to treat your sinus infection.  At the end of antibiotics you can take a Diflucan  to help prevent a vaginal yeast infection.  Start the prednisone  tomorrow with breakfast to help with wheezing and shortness of breath.  You can also use your albuterol  inhaler as needed.  Sleep over the humidifier, drinking plenty of water and taking over-the-counter Mucinex can help loosen secretions.  Symptoms should improve with medications.  If no improvement or any changes return to clinic for reevaluation.     ED Prescriptions     Medication Sig Dispense Auth. Provider   predniSONE  (DELTASONE ) 20 MG tablet Take 2 tablets (40 mg total) by mouth daily for 5 days. 10 tablet Dreama, Cameshia Cressman  N, FNP   amoxicillin -clavulanate (AUGMENTIN ) 875-125 MG tablet Take 1 tablet by mouth every 12 (twelve) hours. 14 tablet Dreama, Ellery Meroney  N, FNP   albuterol  (VENTOLIN  HFA) 108 (90 Base) MCG/ACT inhaler Inhale 1-2 puffs into the lungs every 6 (six) hours as needed for wheezing or shortness of breath. 18 g Dreama, Schneur Crowson  N, FNP   fluconazole  (DIFLUCAN ) 150 MG tablet Take 1  tablet (150 mg total) by mouth daily. 1 tablet Dreama Marily SAILOR, FNP      PDMP not reviewed this encounter.   Dreama, Kolby Myung  N, FNP 10/19/24 TRENNA

## 2024-10-19 NOTE — Discharge Instructions (Signed)
 Take the antibiotics twice daily with food to treat your sinus infection.  At the end of antibiotics you can take a Diflucan  to help prevent a vaginal yeast infection.  Start the prednisone  tomorrow with breakfast to help with wheezing and shortness of breath.  You can also use your albuterol  inhaler as needed.  Sleep over the humidifier, drinking plenty of water and taking over-the-counter Mucinex can help loosen secretions.  Symptoms should improve with medications.  If no improvement or any changes return to clinic for reevaluation.
